# Patient Record
Sex: Female | Born: 1950 | Race: Black or African American | Hispanic: No | Marital: Single | Smoking: Never smoker
Health system: Southern US, Community
[De-identification: ages and names within clinical notes are randomized; demographics above are authoritative.]

## PROBLEM LIST (undated history)

## (undated) DIAGNOSIS — J449 Chronic obstructive pulmonary disease, unspecified: Secondary | ICD-10-CM

## (undated) DIAGNOSIS — G309 Alzheimer's disease, unspecified: Secondary | ICD-10-CM

## (undated) DIAGNOSIS — F0280 Dementia in other diseases classified elsewhere without behavioral disturbance: Secondary | ICD-10-CM

## (undated) DIAGNOSIS — I10 Essential (primary) hypertension: Secondary | ICD-10-CM

## (undated) DIAGNOSIS — B182 Chronic viral hepatitis C: Secondary | ICD-10-CM

## (undated) DIAGNOSIS — R569 Unspecified convulsions: Secondary | ICD-10-CM

## (undated) HISTORY — PX: ABDOMINAL HYSTERECTOMY: SHX81

---

## 2004-01-09 ENCOUNTER — Other Ambulatory Visit: Payer: Self-pay

## 2004-08-05 ENCOUNTER — Other Ambulatory Visit: Payer: Self-pay

## 2005-02-03 ENCOUNTER — Ambulatory Visit: Payer: Self-pay | Admitting: Internal Medicine

## 2006-01-17 ENCOUNTER — Emergency Department: Payer: Self-pay | Admitting: Emergency Medicine

## 2008-03-10 ENCOUNTER — Ambulatory Visit: Payer: Self-pay | Admitting: Internal Medicine

## 2008-05-31 ENCOUNTER — Other Ambulatory Visit: Payer: Self-pay

## 2008-05-31 ENCOUNTER — Inpatient Hospital Stay: Payer: Self-pay | Admitting: Internal Medicine

## 2008-06-03 ENCOUNTER — Emergency Department: Payer: Self-pay | Admitting: Emergency Medicine

## 2008-06-25 ENCOUNTER — Observation Stay: Payer: Self-pay | Admitting: Internal Medicine

## 2008-06-25 ENCOUNTER — Other Ambulatory Visit: Payer: Self-pay

## 2008-06-26 ENCOUNTER — Emergency Department: Payer: Self-pay | Admitting: Emergency Medicine

## 2008-06-26 ENCOUNTER — Other Ambulatory Visit: Payer: Self-pay

## 2008-06-27 ENCOUNTER — Inpatient Hospital Stay: Payer: Self-pay | Admitting: Internal Medicine

## 2008-06-27 ENCOUNTER — Other Ambulatory Visit: Payer: Self-pay

## 2008-08-01 ENCOUNTER — Other Ambulatory Visit: Payer: Self-pay

## 2008-08-01 ENCOUNTER — Emergency Department: Payer: Self-pay | Admitting: Emergency Medicine

## 2008-12-11 ENCOUNTER — Emergency Department: Payer: Self-pay | Admitting: Emergency Medicine

## 2008-12-23 ENCOUNTER — Emergency Department: Payer: Self-pay | Admitting: Emergency Medicine

## 2008-12-31 ENCOUNTER — Ambulatory Visit: Payer: Self-pay | Admitting: Cardiovascular Disease

## 2009-01-17 ENCOUNTER — Inpatient Hospital Stay: Payer: Self-pay | Admitting: Internal Medicine

## 2009-01-30 IMAGING — CT CT HEAD WITHOUT CONTRAST
2 series · 15 of 30 positions shown, 19 images · non-contrast
Comparison: No comparison

REASON FOR EXAM: DIZZY, WEAK
COMMENTS:

PROCEDURE:     CT  - CT HEAD WITHOUT CONTRAST  - June 26, 2008 [DATE]
RESULT:     Noncontrast CT of the brain dated 06/26/2008.
INDICATION: Dizziness
TECHNIQUE: Multiple axial images from the foramen magnum to the vertex were
obtained without IV contrast.

[Series 2: without · axial · non-contrast · 0.39mm/px · z∈[-132,-12]mm · 13 of 29 slices shown, 17 images]
[im 3/29  brain]
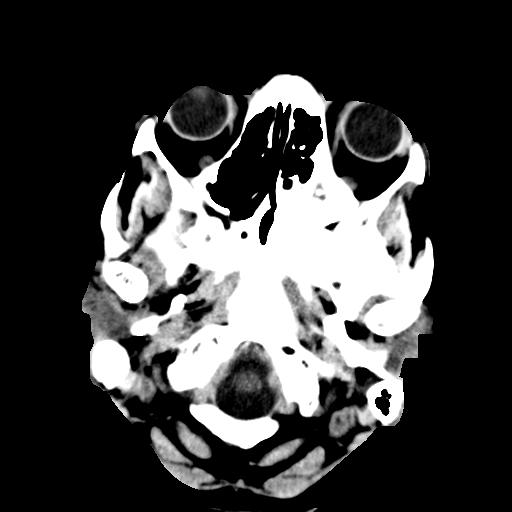
[im 3/29  bone]
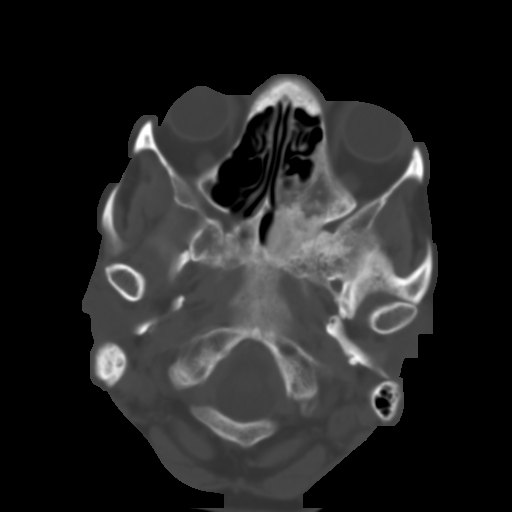
[im 5/29  brain]
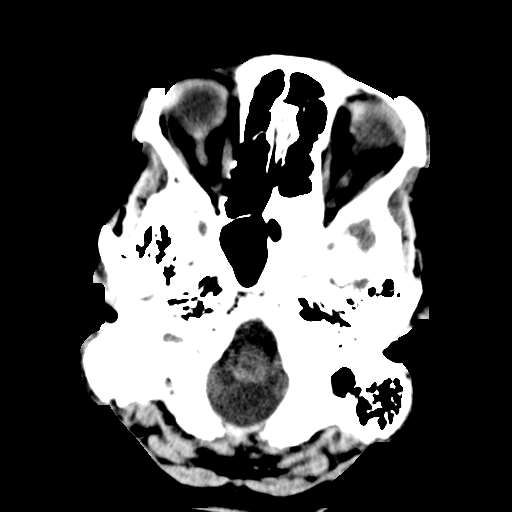
[im 7/29  brain]
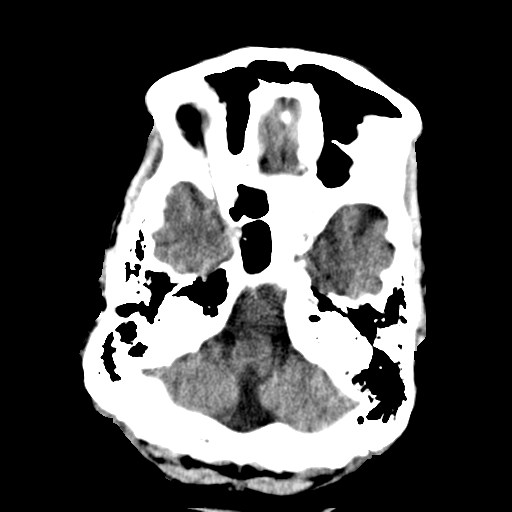
[im 9/29  brain]
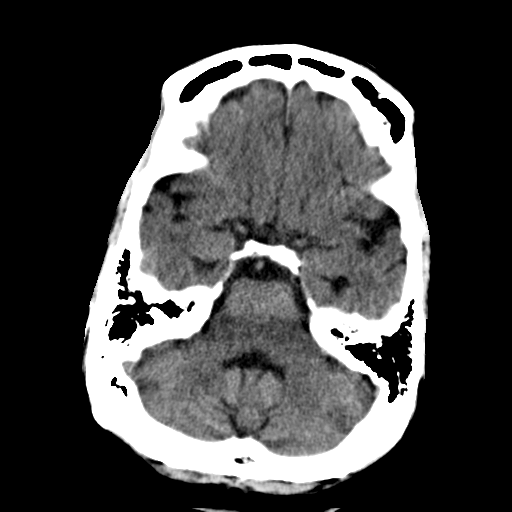
[im 11/29  brain]
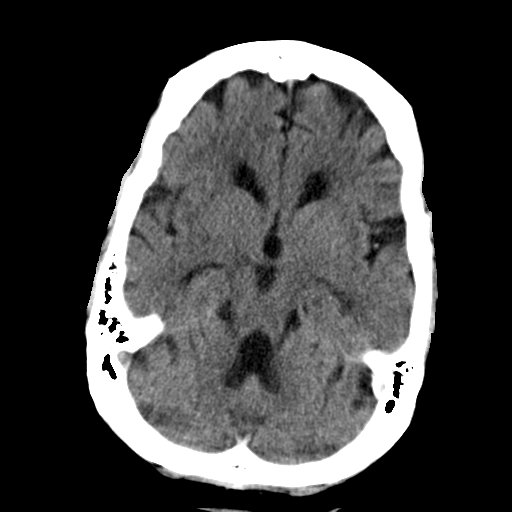
[im 11/29  bone]
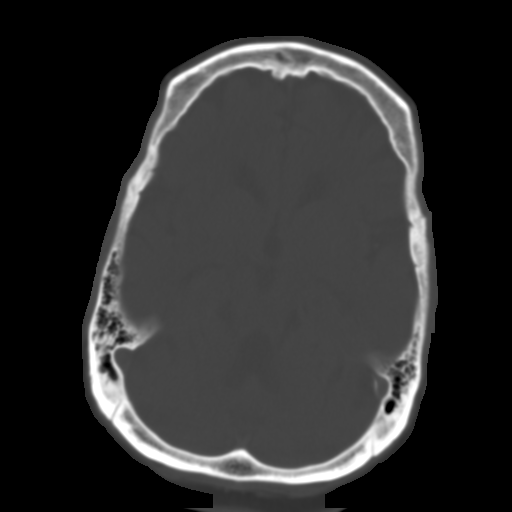
[im 13/29  brain]
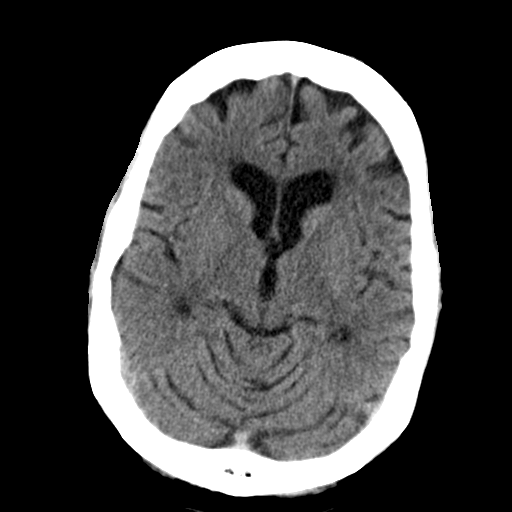
[im 15/29  brain]
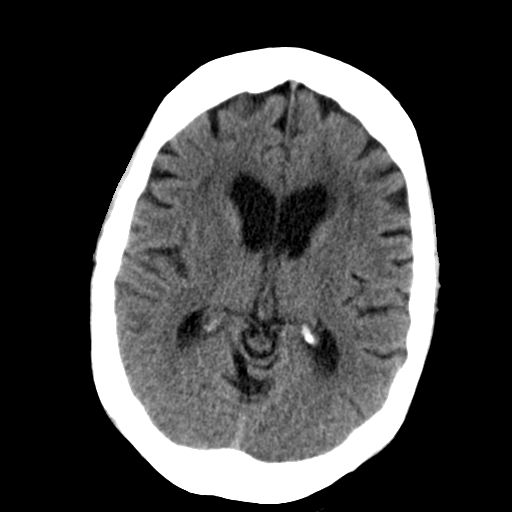
[im 17/29  brain]
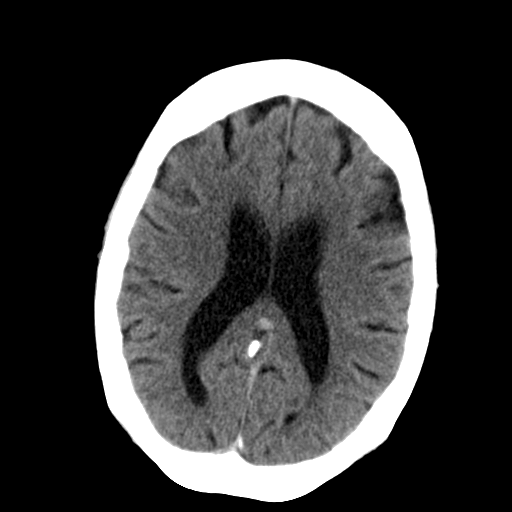
[im 19/29  brain]
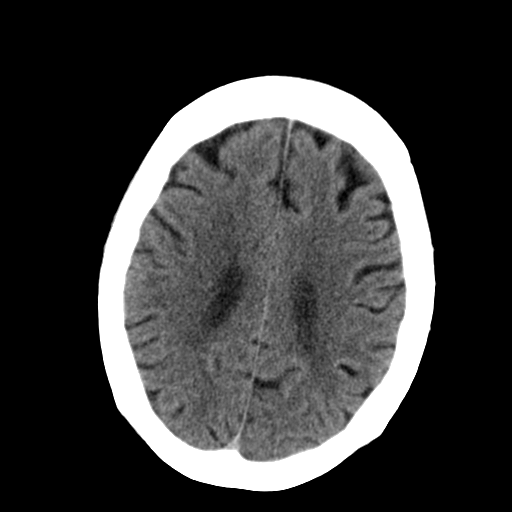
[im 19/29  bone]
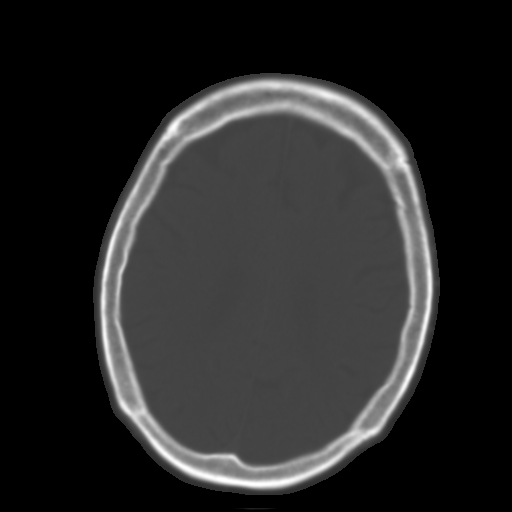
[im 21/29  brain]
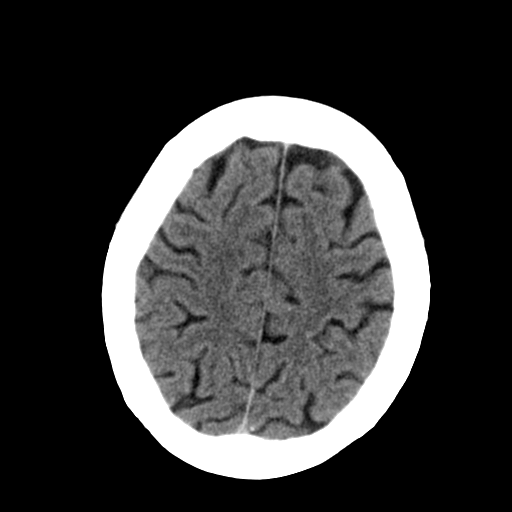
[im 23/29  brain]
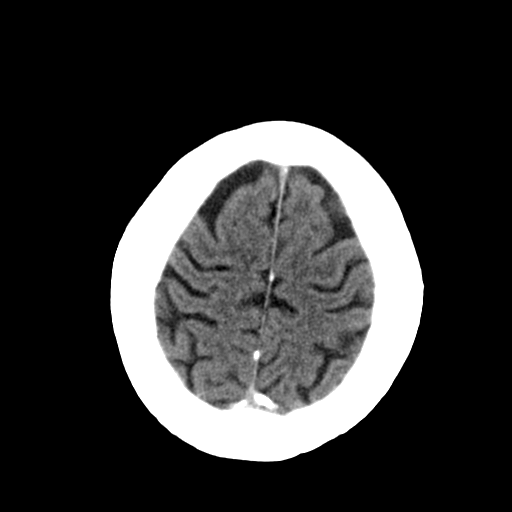
[im 25/29  brain]
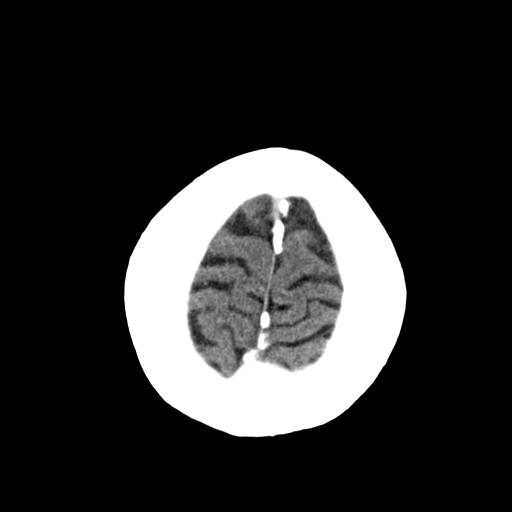
[im 27/29  brain]
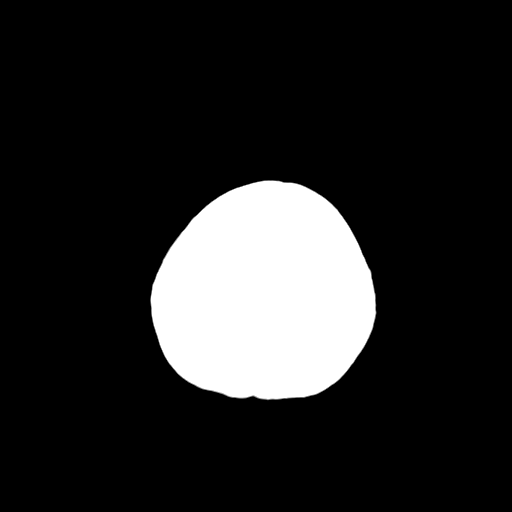
[im 27/29  bone]
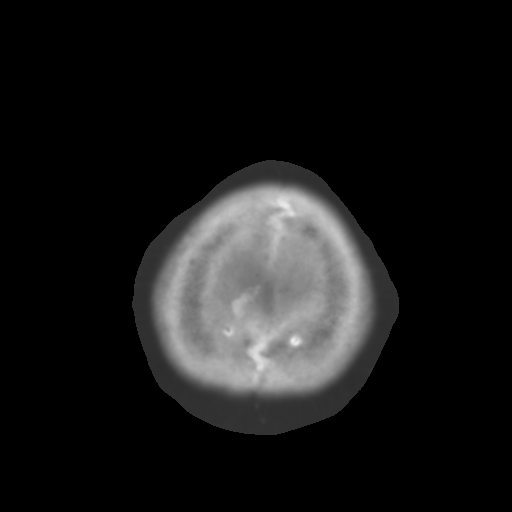

[Series 3: bone · axial · 0.39mm/px · z∈[-132,-112]mm · 2 of 29 slices shown]
[im 3/29  bone]
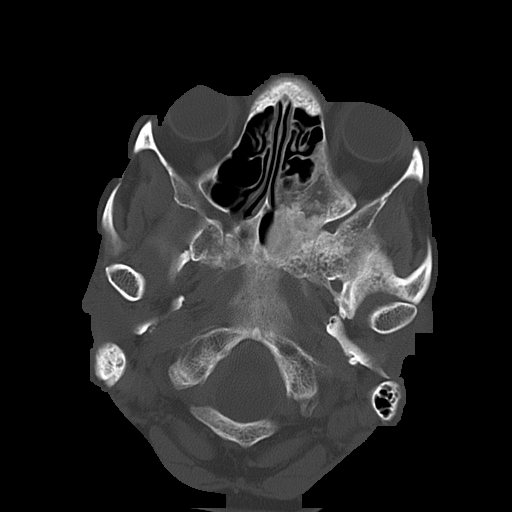
[im 7/29  bone]
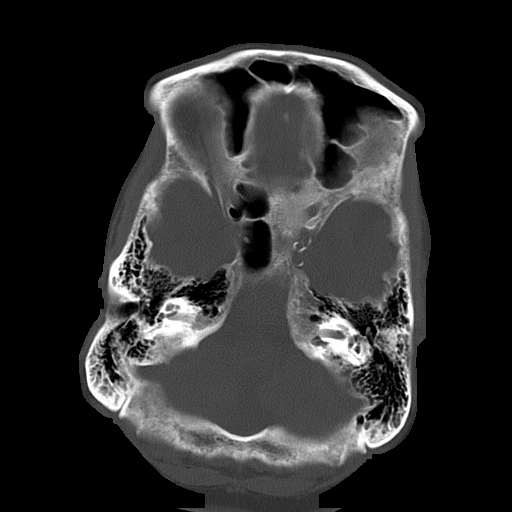

[15 of 30 positions shown; findings below may reference images not displayed]

FINDINGS: There is no evidence for mass effect, midline shift, or extra-axial fluid
collections.There is generalized cerebral atrophy. There is mild
periventricular white matter low attenuation likely secondary to
microangiopathy.  There is no evidence for space-occupying lesion or
intracranial hemorrhage. There is no evidence for cortical-based area of
infarction.

Ventricles and sulci are appropriate for the patient's age. The basal
cisterns are patent.

Visualized portions of the orbits are unremarkable. The paranasal sinuses
and mastoid air cells are unremarkable.

There is partial opacification of the right mastoid air cells. The left
sphenoid demonstrates a groundglass expansile lesion extending anteriorly to
involve the left ethmoid with deformity of the left orbit and involving the
left side of the clivus most consistent with fibrous dysplasia.
IMPRESSION: No acute intracranial process.

Partial opacification of the right mastoid air cells.

## 2009-03-01 ENCOUNTER — Emergency Department: Payer: Self-pay | Admitting: Emergency Medicine

## 2009-03-12 ENCOUNTER — Ambulatory Visit: Payer: Self-pay | Admitting: Internal Medicine

## 2009-03-18 ENCOUNTER — Ambulatory Visit: Payer: Self-pay | Admitting: Internal Medicine

## 2009-10-11 ENCOUNTER — Ambulatory Visit: Payer: Self-pay | Admitting: Gastroenterology

## 2010-03-14 ENCOUNTER — Ambulatory Visit: Payer: Self-pay | Admitting: Internal Medicine

## 2012-02-12 ENCOUNTER — Emergency Department: Payer: Self-pay | Admitting: Emergency Medicine

## 2012-02-12 LAB — URINALYSIS, COMPLETE
Bacteria: NONE SEEN
Bilirubin,UR: NEGATIVE
Blood: NEGATIVE
Glucose,UR: NEGATIVE mg/dL (ref 0–75)
Hyaline Cast: 7
Ketone: NEGATIVE
Nitrite: NEGATIVE
Specific Gravity: 1.016 (ref 1.003–1.030)
Squamous Epithelial: 3
WBC UR: 1 /HPF (ref 0–5)

## 2012-02-12 LAB — COMPREHENSIVE METABOLIC PANEL
Alkaline Phosphatase: 83 U/L (ref 50–136)
Anion Gap: 12 (ref 7–16)
BUN: 19 mg/dL — ABNORMAL HIGH (ref 7–18)
Bilirubin,Total: 0.3 mg/dL (ref 0.2–1.0)
Chloride: 102 mmol/L (ref 98–107)
Co2: 24 mmol/L (ref 21–32)
Creatinine: 1.07 mg/dL (ref 0.60–1.30)
EGFR (African American): >60
EGFR (Non-African Amer.): 55 — ABNORMAL LOW
Osmolality: 277 (ref 275–301)
SGPT (ALT): 31 U/L
Sodium: 138 mmol/L (ref 136–145)
Total Protein: 8.8 g/dL — ABNORMAL HIGH (ref 6.4–8.2)

## 2012-02-12 LAB — LIPASE, BLOOD: Lipase: 309 U/L (ref 73–393)

## 2012-02-12 LAB — CBC
MCHC: 32.4 g/dL (ref 32.0–36.0)
MCV: 86 fL (ref 80–100)
Platelet: 157 10*3/uL (ref 150–440)
RDW: 14.3 % (ref 11.5–14.5)
WBC: 4.5 10*3/uL (ref 3.6–11.0)

## 2012-05-01 ENCOUNTER — Ambulatory Visit: Payer: Self-pay | Admitting: Internal Medicine

## 2012-05-18 ENCOUNTER — Emergency Department: Payer: Self-pay | Admitting: Emergency Medicine

## 2012-05-18 LAB — COMPREHENSIVE METABOLIC PANEL
Albumin: 3.2 g/dL — ABNORMAL LOW (ref 3.4–5.0)
BUN: 14 mg/dL (ref 7–18)
Creatinine: 1.14 mg/dL (ref 0.60–1.30)
EGFR (Non-African Amer.): 51 — ABNORMAL LOW
Osmolality: 268 (ref 275–301)
Potassium: 3.9 mmol/L (ref 3.5–5.1)
SGOT(AST): 48 U/L — ABNORMAL HIGH (ref 15–37)
SGPT (ALT): 44 U/L
Sodium: 134 mmol/L — ABNORMAL LOW (ref 136–145)

## 2012-05-18 LAB — CBC WITH DIFFERENTIAL/PLATELET
Basophil %: 0.7 %
Eosinophil #: 0 10*3/uL (ref 0.0–0.7)
Eosinophil %: 0.8 %
HGB: 13.8 g/dL (ref 12.0–16.0)
Lymphocyte #: 2.6 10*3/uL (ref 1.0–3.6)
Lymphocyte %: 46.7 %
Monocyte %: 5.8 %
Platelet: 156 10*3/uL (ref 150–440)
RBC: 5.09 10*6/uL (ref 3.80–5.20)
RDW: 14.6 % — ABNORMAL HIGH (ref 11.5–14.5)
WBC: 5.6 10*3/uL (ref 3.6–11.0)

## 2012-05-18 LAB — TROPONIN I: Troponin-I: <0.02 ng/mL

## 2012-05-18 LAB — MAGNESIUM: Magnesium: 1.7 mg/dL — ABNORMAL LOW

## 2012-05-18 LAB — CK TOTAL AND CKMB (NOT AT ARMC)
CK, Total: 117 U/L (ref 21–215)
CK-MB: 1.1 ng/mL (ref 0.5–3.6)

## 2012-10-28 ENCOUNTER — Ambulatory Visit: Payer: Self-pay | Admitting: Emergency Medicine

## 2013-01-24 ENCOUNTER — Ambulatory Visit: Payer: Self-pay | Admitting: Cardiovascular Disease

## 2013-12-23 ENCOUNTER — Ambulatory Visit: Payer: Self-pay | Admitting: Internal Medicine

## 2015-03-09 ENCOUNTER — Ambulatory Visit: Payer: Self-pay | Admitting: Podiatry

## 2015-03-21 ENCOUNTER — Emergency Department (HOSPITAL_COMMUNITY): Payer: Medicare Other

## 2015-03-21 ENCOUNTER — Emergency Department (HOSPITAL_COMMUNITY)
Admission: EM | Admit: 2015-03-21 | Discharge: 2015-03-21 | Disposition: A | Discharge status: Home / Self Care | Payer: Medicare Other | Attending: Emergency Medicine | Admitting: Emergency Medicine

## 2015-03-21 ENCOUNTER — Encounter (HOSPITAL_COMMUNITY): Payer: Self-pay

## 2015-03-21 DIAGNOSIS — Z7982 Long term (current) use of aspirin: Secondary | ICD-10-CM | POA: Diagnosis not present

## 2015-03-21 DIAGNOSIS — Z79899 Other long term (current) drug therapy: Secondary | ICD-10-CM | POA: Insufficient documentation

## 2015-03-21 DIAGNOSIS — Z7951 Long term (current) use of inhaled steroids: Secondary | ICD-10-CM | POA: Insufficient documentation

## 2015-03-21 DIAGNOSIS — L02212 Cutaneous abscess of back [any part, except buttock]: Secondary | ICD-10-CM | POA: Insufficient documentation

## 2015-03-21 DIAGNOSIS — R9389 Abnormal findings on diagnostic imaging of other specified body structures: Secondary | ICD-10-CM

## 2015-03-21 DIAGNOSIS — L0291 Cutaneous abscess, unspecified: Secondary | ICD-10-CM

## 2015-03-21 DIAGNOSIS — R05 Cough: Secondary | ICD-10-CM

## 2015-03-21 DIAGNOSIS — Z7902 Long term (current) use of antithrombotics/antiplatelets: Secondary | ICD-10-CM | POA: Diagnosis not present

## 2015-03-21 DIAGNOSIS — R059 Cough, unspecified: Secondary | ICD-10-CM

## 2015-03-21 MED ORDER — SULFAMETHOXAZOLE-TRIMETHOPRIM 800-160 MG PO TABS: 1.0000 | ORAL_TABLET | Freq: Two times a day (BID) | ORAL | Status: AC

## 2015-03-21 NOTE — ED Notes (Signed)
Staff [facility] observed pt. To have a prolonged "coughing fit".  The patient is nonplussed by this incident, treating it as "something that happens once-in-a-while".  She is more interested in a small abscess on her low back just to right of midline.  She is alert and in no distress.

## 2015-03-21 NOTE — Consult Note (Signed)
PATIENT NAME:  Tanya Rivas, Tanya Rivas MR#:  [id number] DATE OF BIRTH:  1951-06-15  DATE OF CONSULTATION:  05/18/2012  REFERRING PHYSICIAN:   CONSULTING PHYSICIAN:  Nechemia Chiappetta A. Allena Katz, MD  PRIMARY CARE PHYSICIAN:  Dr. Yves Dill  CHIEF COMPLAINT:  Syncopal episode.   HISTORY OF PRESENT ILLNESS: The patient is a pleasant 65 year old African American female who is a resident of  [facility] assisted living, who comes to the Emergency Room after she had a syncopal episode today. The patient reports sitting outside on the porch for some time. She started feeling hot and flushed and decided to return inside the house. She closed the door and immediately she felt dizzy and had a syncopal episode where she remembered the whole episode and going down on the floor. She denies any injury, any nausea or vomiting. No seizure witnessed. No incontinence of urine or stool. She comes to the Emergency Room, has been hemodynamically stable, no orthostasis was noted in the ER. When I initially went to see the patient, the patient had gone out for a walk with her friend. She returned back to the room, was feeling hungry and wanted to eat. She did not have any focal weakness or any dizziness and her vitals remained stable.   PAST MEDICAL HISTORY:  1. Seizure disorder secondary to alcohol dependence.  2. 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 The patient quit drinking.  3. History of hypertension.  4. Hyperlipidemia.  5. Dementia.  6. Tobacco abuse.   SOCIAL HISTORY: She is a resident at [facility] assisted living. She smokes about 1 pack in 2 to 3 days. Denies any alcohol use or drug use.   ALLERGIES: No known drug allergies.   MEDICATIONS: 1. Advair 115/21-mcg inhalation, 2 puffs b.i.d.  2. Amlodipine/benazepril 5/10, 1 p.o. daily.  3. Aspirin 81 mg daily.  4. Plavix 75 mg daily.  5. Crestor 20 mg at bedtime. 6. Donepezil 10 mg at bedtime.  7. Lansoprazole 30 mg daily.   8. Keppra 500 mg b.i.d.  9. Remeron 15 mg at bedtime.  10. Multivitamin p.o. daily.  11. Spiriva 18-mcg inhalation daily.  12. Thiamine 100 mg daily.  13. Vitamin D 2000 mg daily.   PAST SURGICAL HISTORY:  1. Tonsillectomy.  2. Hysterectomy.  3. Cardiac catheterization in the past.   FAMILY HISTORY: No history of diabetes or hypertension.   REVIEW OF SYSTEMS: CONSTITUTIONAL: No fever, fatigue, or weakness. EYES: No blurred or double vision. ENT: No tinnitus, ear pain, or hearing loss. RESPIRATORY: No cough, wheeze, hemoptysis, or dyspnea. CARDIOVASCULAR: No chest pain, orthopnea, or edema. GASTROINTESTINAL: No nausea, vomiting, diarrhea, abdominal pain, hematemesis, or gastroesophageal reflux disease. GU: No dysuria or hematuria. ENDOCRINE: No polyuria, nocturia, or thyroid problems. HEMATOLOGY: No anemia or easy bruising. SKIN: No acne or rash. MUSCULOSKELETAL: Positive for arthritis. NEUROLOGIC: No cerebrovascular accident or transient ischemic attack. PSYCH: Positive for dementia. All other systems reviewed and negative.   PHYSICAL EXAMINATION:  GENERAL: The patient is awake, alert, oriented times three, not in acute distress.   VITAL SIGNS: Afebrile, pulse 71, blood pressure 131/86, sats 98% on room air.   HEENT: Atraumatic, normocephalic. Pupils are equal, round, and reactive to light and accommodation. Extraocular movements intact. Oral mucosa is moist.   NECK: Supple. No JVD. No carotid bruit.   LUNGS: Clear to auscultation bilaterally. No rales, rhonchi, respiratory distress, or labored breathing.   CARDIOVASCULAR: Both the heart sounds are normal. Rate and rhythm are regular.  PMI not lateralized. Chest is nontender.   EXTREMITIES: Good pedal pulses, good femoral pulses. No lower extremity edema.   ABDOMEN: Soft, benign, and nontender. No organomegaly. Positive bowel sounds.   NEUROLOGIC:  Grossly intact cranial nerves II-XII. No motor or sensory deficits.   PSYCH: The  patient is awake, alert, oriented times three.   SKIN: Warm and dry.   LABORATORY DATA: EKG shows sinus rhythm with marked sinus arrhythmia. CT of the head: Atrophy with chronic small vessel ischemic disease, no acute intracranial abnormality. CBC within normal limits. Comprehensive metabolic panel within normal limits except SGOT of 48, sodium 134, and albumin of 3.2. Cardiac enzymes- first set negative. Blood glucose 114.  Orthostatic vitals were normal.   ASSESSMENT: 64 year old Tanya Rivas with: 1. Syncopal episode, suspected vasovagal. The patient reports she was out in the heat, felt hot and went in the house and had a syncopal episode, remembering the episode. No witnessed seizure. The patient went out for a walk with her boyfriend earlier in the Emergency Room. Orthostatic vitals are normal. Doing well.  No neuro deficits. EKG normal sinus rhythm. CT head negative.  2. Labs at baseline. 3. History of seizure disorder on Keppra.  4. 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 Does not drink any more.  5. Hyperlipidemia, on Crestor.   The patient remained clinically stable through the ER stay. She did not have any neuro deficits. Her work-up remains negative. The patient falls? at baseline. We will continue home medications, discharge the patient home to her assisted living. Followup in one week. I have already left a message for Oran ReinBrenda Touraine, the facility's coordinator to come pick Tanya Rivas up.  The patient will follow up with Dr. Yves DillNeelam Khan in 1 to 2 weeks.   TIME SPENT:  50 minutes.  ____________________________ Wylie HailSona A. Allena KatzPatel, MD sap:bjt D: 05/18/2012 19:46:19 ET T: 05/19/2012 11:11:51 ET JOB#: [id number]  cc: Giovanni Biby A. Allena KatzPatel, MD, <Dictator> Margaretann LovelessNeelam S. Khan, MD Willow OraSONA A Macari Zalesky MD ELECTRONICALLY SIGNED 05/30/2012 14:09

## 2015-03-21 NOTE — ED Provider Notes (Signed)
CSN: [id number]     Arrival date & time 03/21/15  1829 History   First MD Initiated Contact with Patient 03/21/15 1915     Chief Complaint  Patient presents with  . Cough     (Consider location/radiation/quality/duration/timing/severity/associated sxs/prior Treatment) HPI   Tanya Rivas is a 64 y.o. female sent from [facility] for evaluation of a prolonged coughing fit. Patient states that she does have cough every once in a while. Patient denies chest pain, shortness of breath, productive cough, fever, chills, nausea, vomiting, change in bowel or bladder habits she notes to have a slightly painful rash to her right back. Would like to have that evaluated.  History reviewed. No pertinent past medical history. No past surgical history on file. No family history on file. History  Substance Use Topics  . Smoking status: Never Smoker   . Smokeless tobacco: Not on file  . Alcohol Use: No   OB History    No data available     Review of Systems  10 systems reviewed and found to be negative, except as noted in the HPI.   Allergies  Review of patient's allergies indicates no known allergies.  Home Medications   Prior to Admission medications   Medication Sig Start Date End Date Taking? Authorizing Provider  acetaminophen (TYLENOL) 500 MG tablet Take 500 mg by mouth every 4 (four) hours as needed for fever.   Yes Historical Provider, MD  alum & mag hydroxide-simeth (MAALOX/MYLANTA) 200-200-20 MG/5ML suspension Take 30 mLs by mouth every 6 (six) hours as needed for indigestion or heartburn.   Yes Historical Provider, MD  amLODipine (NORVASC) 5 MG tablet Take 5 mg by mouth daily.   Yes Historical Provider, MD  aspirin 81 MG chewable tablet Chew 81 mg by mouth daily.   Yes Historical Provider, MD  benazepril (LOTENSIN) 10 MG tablet Take 10 mg by mouth daily.   Yes Historical Provider, MD  cholecalciferol (VITAMIN D) 1000 UNITS tablet Take 1,000 Units by mouth daily.   Yes  Historical Provider, MD  clopidogrel (PLAVIX) 75 MG tablet Take 75 mg by mouth daily.   Yes Historical Provider, MD  donepezil (ARICEPT) 10 MG tablet Take 10 mg by mouth at bedtime.   Yes Historical Provider, MD  Fluticasone-Salmeterol (ADVAIR) 250-50 MCG/DOSE AEPB Inhale 1 puff into the lungs 2 (two) times daily.   Yes Historical Provider, MD  furosemide (LASIX) 20 MG tablet Take 20 mg by mouth daily.   Yes Historical Provider, MD  guaiFENesin (ROBITUSSIN) 100 MG/5ML SOLN Take 10 mLs by mouth every 6 (six) hours as needed for cough or to loosen phlegm.   Yes Historical Provider, MD  isosorbide mononitrate (IMDUR) 60 MG 24 hr tablet Take 60 mg by mouth daily.   Yes Historical Provider, MD  levETIRAcetam (KEPPRA) 500 MG tablet Take 500 mg by mouth 2 (two) times daily.   Yes Historical Provider, MD  loperamide (IMODIUM) 2 MG capsule Take 2 mg by mouth as needed for diarrhea or loose stools.   Yes Historical Provider, MD  magnesium hydroxide (MILK OF MAGNESIA) 400 MG/5ML suspension Take 30 mLs by mouth at bedtime as needed for mild constipation.   Yes Historical Provider, MD  Melatonin 3 MG TABS Take 1 tablet by mouth at bedtime.   Yes Historical Provider, MD  memantine (NAMENDA XR) 14 MG CP24 24 hr capsule Take 14 mg by mouth daily.   Yes Historical Provider, MD  metoprolol (LOPRESSOR) 50 MG tablet Take 50 mg  by mouth 2 (two) times daily.   Yes Historical Provider, MD  mirtazapine (REMERON) 15 MG tablet Take 15 mg by mouth at bedtime.   Yes Historical Provider, MD  Multiple Vitamins-Minerals (MULTIVITAMIN & MINERAL PO) Take 1 tablet by mouth daily.   Yes Historical Provider, MD  neomycin-bacitracin-polymyxin (NEOSPORIN) [phone] ointment Apply 1 application topically daily as needed.   Yes Historical Provider, MD  ranolazine (RANEXA) 1000 MG SR tablet Take 1,000 mg by mouth 2 (two) times daily.   Yes Historical Provider, MD  thiamine (VITAMIN B-1) 100 MG tablet Take 100 mg by mouth daily.   Yes  Historical Provider, MD  tiotropium (SPIRIVA) 18 MCG inhalation capsule Place 18 mcg into inhaler and inhale daily.   Yes Historical Provider, MD  traZODone (DESYREL) 50 MG tablet Take 50 mg by mouth at bedtime.   Yes Historical Provider, MD  sulfamethoxazole-trimethoprim (BACTRIM DS) 800-160 MG per tablet Take 1 tablet by mouth 2 (two) times daily. 03/21/15 03/27/15  Jasira Robinson, PA-C   BP 148/80 mmHg  Pulse 94  Temp(Src) 98.7 F (37.1 C) (Oral)  Resp 18  SpO2 97% Physical Exam  Constitutional: She is oriented to person, place, and time. She appears well-developed and well-nourished. No distress.  HENT:  Head: Normocephalic and atraumatic.  Mouth/Throat: Oropharynx is clear and moist.  Eyes: Conjunctivae and EOM are normal. Pupils are equal, round, and reactive to light.  Neck: Normal range of motion.  Cardiovascular: Normal rate, regular rhythm and intact distal pulses.   Pulmonary/Chest: Effort normal and breath sounds normal.  Abdominal: Soft. There is no tenderness.  Musculoskeletal: Normal range of motion.  Neurological: She is alert and oriented to person, place, and time.  Skin: She is not diaphoretic.  Half centimeter early abscess with no surrounding cellulitis or drainage. Mildly tender to palpation, no fluctuance.  Psychiatric: She has a normal mood and affect.  Nursing note and vitals reviewed.   ED Course  Procedures (including critical care time) Labs Review Labs Reviewed - No data to display  Imaging Review Dg Chest 2 View  03/21/2015   CLINICAL DATA:  Abnormal chest x-ray.  Cough  EXAM: CHEST  2 VIEW  COMPARISON:  05/18/2012  FINDINGS: Heart size and vascularity normal. Lungs are clear without infiltrate or effusion. Negative for mass lesion. No change from the prior study.  IMPRESSION: No active cardiopulmonary disease.   Electronically Signed   By: Marlan Palau M.D.   On: 03/21/2015 20:48     EKG Interpretation None      MDM   Final diagnoses:   Cough  Abscess    Filed Vitals:   03/21/15 1830 03/21/15 2129  BP: 126/90 148/80  Pulse: 119 94  Temp: 97.7 F (36.5 C) 98.7 F (37.1 C)  TempSrc: Oral Oral  Resp: 20 18  SpO2: 97% 97%    Tanya Rivas is a pleasant 64 y.o. female presenting with for evaluation after coughing fit. Lung sounds are clear to auscultation bilaterally, patient saturating well on room air, she is afebrile no chest pain or shortness of breath to suggest pneumonia. Chest x-rays without infiltrate. She has a small early abscess to the lower back. Do not think I and D will be helpful at this point. Will start her on Bactrim and have advised her to use warm compresses.  Evaluation does not show pathology that would require ongoing emergent intervention or inpatient treatment. Pt is hemodynamically stable and mentating appropriately. Discussed findings and plan with patient/guardian, who  agrees with care plan. All questions answered. Return precautions discussed and outpatient follow up given.   Discharge Medication List as of 03/21/2015  9:04 PM    START taking these medications   Details  sulfamethoxazole-trimethoprim (BACTRIM DS) 800-160 MG per tablet Take 1 tablet by mouth 2 (two) times daily., Starting 03/21/2015, Until Sat 03/27/15, Print             [employer] Rangel Echeverri, PA-C 03/22/15 0030  Lorre NickAnthony Allen, MD 03/23/15 [id number]

## 2015-03-21 NOTE — Discharge Instructions (Signed)
If you see signs of infection (warmth, redness, tenderness, pus, sharp increase in pain, fever, red streaking) immediately return to the emergency department.  Please follow with your primary care doctor in the next 2 days for a check-up. They must obtain records for further management.   Do not hesitate to return to the Emergency Department for any new, worsening or concerning symptoms.    Abscess An abscess (boil or furuncle) is an infected area on or under the skin. This area is filled with yellowish-white fluid (pus) and other material (debris). HOME CARE   Only take medicines as told by your doctor.  If you were given antibiotic medicine, take it as directed. Finish the medicine even if you start to feel better.  If gauze is used, follow your doctor's directions for changing the gauze.  To avoid spreading the infection:  Keep your abscess covered with a bandage.  Wash your hands well.  Do not share personal care items, towels, or whirlpools with others.  Avoid skin contact with others.  Keep your skin and clothes clean around the abscess.  Keep all doctor visits as told. GET HELP RIGHT AWAY IF:   You have more pain, puffiness (swelling), or redness in the wound site.  You have more fluid or blood coming from the wound site.  You have muscle aches, chills, or you feel sick.  You have a fever. MAKE SURE YOU:   Understand these instructions.  Will watch your condition.  Will get help right away if you are not doing well or get worse. Document Released: 05/01/2008 Document Revised: 05/14/2012 Document Reviewed: 01/26/2012 [facility] Patient Information 2015 [employer] [place]. This information is not intended to replace advice given to you by your health care provider. Make sure you discuss any questions you have with your health care provider.

## 2015-03-21 NOTE — ED Notes (Signed)
Notified [facility] for transportation to [facility] 

## 2015-04-15 ENCOUNTER — Encounter (HOSPITAL_COMMUNITY): Payer: Self-pay | Admitting: Emergency Medicine

## 2015-04-15 ENCOUNTER — Emergency Department (HOSPITAL_COMMUNITY)
Admission: EM | Admit: 2015-04-15 | Discharge: 2015-04-15 | Disposition: A | Discharge status: Home / Self Care | Payer: Medicare Other | Attending: Emergency Medicine | Admitting: Emergency Medicine

## 2015-04-15 ENCOUNTER — Emergency Department (HOSPITAL_COMMUNITY): Payer: Medicare Other

## 2015-04-15 DIAGNOSIS — R569 Unspecified convulsions: Secondary | ICD-10-CM

## 2015-04-15 DIAGNOSIS — Z8619 Personal history of other infectious and parasitic diseases: Secondary | ICD-10-CM | POA: Diagnosis not present

## 2015-04-15 DIAGNOSIS — J449 Chronic obstructive pulmonary disease, unspecified: Secondary | ICD-10-CM | POA: Insufficient documentation

## 2015-04-15 DIAGNOSIS — Z79899 Other long term (current) drug therapy: Secondary | ICD-10-CM | POA: Diagnosis not present

## 2015-04-15 DIAGNOSIS — I10 Essential (primary) hypertension: Secondary | ICD-10-CM | POA: Diagnosis not present

## 2015-04-15 DIAGNOSIS — Z7951 Long term (current) use of inhaled steroids: Secondary | ICD-10-CM | POA: Diagnosis not present

## 2015-04-15 DIAGNOSIS — Z7902 Long term (current) use of antithrombotics/antiplatelets: Secondary | ICD-10-CM | POA: Diagnosis not present

## 2015-04-15 DIAGNOSIS — F0280 Dementia in other diseases classified elsewhere without behavioral disturbance: Secondary | ICD-10-CM | POA: Insufficient documentation

## 2015-04-15 DIAGNOSIS — G309 Alzheimer's disease, unspecified: Secondary | ICD-10-CM | POA: Diagnosis not present

## 2015-04-15 DIAGNOSIS — G40909 Epilepsy, unspecified, not intractable, without status epilepticus: Secondary | ICD-10-CM | POA: Insufficient documentation

## 2015-04-15 DIAGNOSIS — Z7982 Long term (current) use of aspirin: Secondary | ICD-10-CM | POA: Insufficient documentation

## 2015-04-15 HISTORY — DX: Essential (primary) hypertension: I10

## 2015-04-15 HISTORY — DX: Unspecified convulsions: R56.9

## 2015-04-15 HISTORY — DX: Dementia in other diseases classified elsewhere, unspecified severity, without behavioral disturbance, psychotic disturbance, mood disturbance, and anxiety: F02.80

## 2015-04-15 HISTORY — DX: Alzheimer's disease, unspecified: G30.9

## 2015-04-15 HISTORY — DX: Chronic obstructive pulmonary disease, unspecified: J44.9

## 2015-04-15 HISTORY — DX: Chronic viral hepatitis C: B18.2

## 2015-04-15 LAB — CBC WITH DIFFERENTIAL/PLATELET
Basophils Absolute: 0 10*3/uL (ref 0.0–0.1)
Basophils Relative: 0 % (ref 0–1)
EOS PCT: 2 % (ref 0–5)
Eosinophils Absolute: 0.1 10*3/uL (ref 0.0–0.7)
HEMATOCRIT: 33.8 % — AB (ref 36.0–46.0)
Hemoglobin: 10.5 g/dL — ABNORMAL LOW (ref 12.0–15.0)
Lymphocytes Relative: 51 % — ABNORMAL HIGH (ref 12–46)
Lymphs Abs: 2.6 10*3/uL (ref 0.7–4.0)
MCH: 27.8 pg (ref 26.0–34.0)
MCHC: 31.1 g/dL (ref 30.0–36.0)
MCV: 89.4 fL (ref 78.0–100.0)
MONO ABS: 0.4 10*3/uL (ref 0.1–1.0)
Monocytes Relative: 8 % (ref 3–12)
NEUTROS ABS: 2 10*3/uL (ref 1.7–7.7)
Neutrophils Relative %: 39 % — ABNORMAL LOW (ref 43–77)
PLATELETS: 178 10*3/uL (ref 150–400)
RBC: 3.78 MIL/uL — ABNORMAL LOW (ref 3.87–5.11)
RDW: 14.6 % (ref 11.5–15.5)
WBC: 5 10*3/uL (ref 4.0–10.5)

## 2015-04-15 LAB — I-STAT TROPONIN, ED: Troponin i, poc: 0 ng/mL (ref 0.00–0.08)

## 2015-04-15 LAB — COMPREHENSIVE METABOLIC PANEL
ALK PHOS: 67 U/L (ref 38–126)
ALT: 29 U/L (ref 14–54)
AST: 33 U/L (ref 15–41)
Albumin: 2.9 g/dL — ABNORMAL LOW (ref 3.5–5.0)
Anion gap: 7 (ref 5–15)
BILIRUBIN TOTAL: 0.4 mg/dL (ref 0.3–1.2)
BUN: 26 mg/dL — AB (ref 6–20)
CO2: 26 mmol/L (ref 22–32)
Calcium: 8.4 mg/dL — ABNORMAL LOW (ref 8.9–10.3)
Chloride: 101 mmol/L (ref 101–111)
Creatinine, Ser: 1.23 mg/dL — ABNORMAL HIGH (ref 0.44–1.00)
GFR, EST AFRICAN AMERICAN: 52 mL/min — AB (ref >60)
GFR, EST NON AFRICAN AMERICAN: 45 mL/min — AB (ref >60)
GLUCOSE: 80 mg/dL (ref 65–99)
POTASSIUM: 4.5 mmol/L (ref 3.5–5.1)
Sodium: 134 mmol/L — ABNORMAL LOW (ref 135–145)
Total Protein: 7.5 g/dL (ref 6.5–8.1)

## 2015-04-15 MED ORDER — LEVETIRACETAM 750 MG PO TABS: 750.0000 mg | ORAL_TABLET | Freq: Two times a day (BID) | ORAL | Status: DC

## 2015-04-15 MED ORDER — LEVETIRACETAM 750 MG PO TABS
750.0000 mg | ORAL_TABLET | Freq: Once | ORAL | Status: AC
  Administered 2015-04-15: 750 mg via ORAL
  Filled 2015-04-15: qty 1

## 2015-04-15 NOTE — Progress Notes (Signed)
CM spoke with pt while in hallway bed to encourage her to stay on the bed.  Pt discussing hot chocolate, her sons. Pt thought she was at [facility] Informed Cm [place] and has three sons.  Pt could not recall coming to ED after having a seizure form [facility]. Pt pleasantly talking about various subjects

## 2015-04-15 NOTE — ED Notes (Signed)
Bed: [facility] Expected date:  Expected time:  Means of arrival:  Comments: EMS 

## 2015-04-15 NOTE — ED Notes (Signed)
Per EMS [facility] of [facility]. Facility noticed pt having seizure "full body movement lasting a minute" was helped to fall. NO LOC or pain.

## 2015-04-15 NOTE — Discharge Instructions (Signed)
Change keppra to 750 mg twice daily. She was given her night dose in the ED today.   Follow up with your doctor.   Return to ER if you have another seizure, passing out.

## 2015-04-15 NOTE — ED Provider Notes (Signed)
CSN: [id number]     Arrival date & time 04/15/15  1431 History   First MD Initiated Contact with Patient 04/15/15 1506     Chief Complaint  Patient presents with  . Seizures     (Consider location/radiation/quality/duration/timing/severity/associated sxs/prior Treatment) The history is provided by the patient.  Roney MarionWanda Grail Drozdowski is a 64 y.o. female hx of dementia, korsakoff syndrome, seizure, here with possible seizure. [facility]. She is currently on Keppra 500 twice a day. The facility noticed that she possibly had a tonic-clonic seizure lasted a minute but she was awake during that time. She was helped to the floor and did not have any head injury. She is sent here per protocol. Upon review of her chart, patient was given Keppra this morning. She was recently evaluated for a cough and abnormal chest x-ray.    Past Medical History  Diagnosis Date  . Alzheimer disease   . Seizures   . Hypertension   . COPD (chronic obstructive pulmonary disease)   . Hepatitis C carrier    History reviewed. No pertinent past surgical history. No family history on file. History  Substance Use Topics  . Smoking status: Never Smoker   . Smokeless tobacco: Not on file  . Alcohol Use: No   OB History    No data available     Review of Systems  Neurological: Positive for seizures.  All other systems reviewed and are negative.     Allergies  Review of patient's allergies indicates no known allergies.  Home Medications   Prior to Admission medications   Medication Sig Start Date End Date Taking? Authorizing Provider  acetaminophen (TYLENOL) 500 MG tablet Take 500 mg by mouth every 4 (four) hours as needed for fever.   Yes Historical Provider, MD  alum & mag hydroxide-simeth (MAALOX/MYLANTA) 200-200-20 MG/5ML suspension Take 30 mLs by mouth every 6 (six) hours as needed for indigestion or heartburn.   Yes Historical Provider, MD  amLODipine (NORVASC) 5 MG  tablet Take 5 mg by mouth daily.   Yes Historical Provider, MD  aspirin 81 MG chewable tablet Chew 81 mg by mouth daily.   Yes Historical Provider, MD  benazepril (LOTENSIN) 10 MG tablet Take 10 mg by mouth daily.   Yes Historical Provider, MD  cholecalciferol (VITAMIN D) 1000 UNITS tablet Take 1,000 Units by mouth daily.   Yes Historical Provider, MD  clopidogrel (PLAVIX) 75 MG tablet Take 75 mg by mouth daily.   Yes Historical Provider, MD  donepezil (ARICEPT) 10 MG tablet Take 10 mg by mouth at bedtime.   Yes Historical Provider, MD  Fluticasone-Salmeterol (ADVAIR) 250-50 MCG/DOSE AEPB Inhale 1 puff into the lungs 2 (two) times daily.   Yes Historical Provider, MD  furosemide (LASIX) 20 MG tablet Take 20 mg by mouth daily.   Yes Historical Provider, MD  guaiFENesin (ROBITUSSIN) 100 MG/5ML SOLN Take 10 mLs by mouth every 6 (six) hours as needed for cough or to loosen phlegm.   Yes Historical Provider, MD  isosorbide mononitrate (IMDUR) 60 MG 24 hr tablet Take 60 mg by mouth daily.   Yes Historical Provider, MD  levETIRAcetam (KEPPRA) 500 MG tablet Take 500 mg by mouth 2 (two) times daily.   Yes Historical Provider, MD  loperamide (IMODIUM) 2 MG capsule Take 2 mg by mouth as needed for diarrhea or loose stools.   Yes Historical Provider, MD  magnesium hydroxide (MILK OF MAGNESIA) 400 MG/5ML suspension Take 30 mLs by  mouth at bedtime as needed for mild constipation.   Yes Historical Provider, MD  Melatonin 3 MG TABS Take 1 tablet by mouth at bedtime.   Yes Historical Provider, MD  memantine (NAMENDA XR) 14 MG CP24 24 hr capsule Take 14 mg by mouth daily.   Yes Historical Provider, MD  metoprolol (LOPRESSOR) 50 MG tablet Take 50 mg by mouth 2 (two) times daily.   Yes Historical Provider, MD  mirtazapine (REMERON) 15 MG tablet Take 15 mg by mouth at bedtime.   Yes Historical Provider, MD  Multiple Vitamins-Minerals (MULTIVITAMIN & MINERAL PO) Take 1 tablet by mouth daily.   Yes Historical Provider, MD   neomycin-bacitracin-polymyxin (NEOSPORIN) [phone] ointment Apply 1 application topically daily as needed.   Yes Historical Provider, MD  ranolazine (RANEXA) 1000 MG SR tablet Take 1,000 mg by mouth 2 (two) times daily.   Yes Historical Provider, MD  thiamine (VITAMIN B-1) 100 MG tablet Take 100 mg by mouth daily.   Yes Historical Provider, MD  tiotropium (SPIRIVA) 18 MCG inhalation capsule Place 18 mcg into inhaler and inhale daily.   Yes Historical Provider, MD  traZODone (DESYREL) 50 MG tablet Take 50 mg by mouth at bedtime.   Yes Historical Provider, MD  VENTOLIN HFA 108 (90 BASE) MCG/ACT inhaler Inhale 1 puff into the lungs every 4 (four) hours as needed for shortness of breath.  03/29/15  Yes Historical Provider, MD   BP 113/66 mmHg  Pulse 63  Temp(Src) 98 F (36.7 C) (Oral)  Resp 16  SpO2 95% Physical Exam  Constitutional:  Chronically ill, awake, alert, demented   HENT:  Head: Normocephalic.  Mouth/Throat: Oropharynx is clear and moist.  Eyes: Conjunctivae and EOM are normal. Pupils are equal, round, and reactive to light.  Neck: Normal range of motion. Neck supple.  Cardiovascular: Normal rate, regular rhythm and normal heart sounds.   Pulmonary/Chest: Effort normal and breath sounds normal.  Abdominal: Soft. Bowel sounds are normal. She exhibits no distension. There is no tenderness. There is no rebound.  Musculoskeletal: Normal range of motion. She exhibits no edema or tenderness.  Neurological: She is alert.  Demented, moving all extremities   Skin: Skin is warm and dry.  Psychiatric:  Unable   Nursing note and vitals reviewed.   ED Course  Procedures (including critical care time) Labs Review Labs Reviewed  CBC WITH DIFFERENTIAL/PLATELET - Abnormal; Notable for the following:    RBC 3.78 (*)    Hemoglobin 10.5 (*)    HCT 33.8 (*)    Neutrophils Relative % 39 (*)    Lymphocytes Relative 51 (*)    All other components within normal limits  COMPREHENSIVE  METABOLIC PANEL - Abnormal; Notable for the following:    Sodium 134 (*)    BUN 26 (*)    Creatinine, Ser 1.23 (*)    Calcium 8.4 (*)    Albumin 2.9 (*)    GFR calc non Af Amer 45 (*)    GFR calc Af Amer 52 (*)    All other components within normal limits  I-STAT TROPOININ, ED    Imaging Review Ct Head Wo Contrast  04/15/2015   CLINICAL DATA:  Seizure.  History of Alzheimer's.  EXAM: CT HEAD WITHOUT CONTRAST  TECHNIQUE: Contiguous axial images were obtained from the base of the skull through the vertex without intravenous contrast.  COMPARISON:  05/18/2012; 03/01/2009  FINDINGS: Similar findings of age advanced atrophy with centralized volume loss and commensurate ex vacuo dilatation of the  ventricular system. Mild diffuse sulcal prominence and prominence of the bifrontal extra-axial spaces. Extensive periventricular hypodensities compatible with microvascular ischemic disease. The Abeyta-white differentiation is otherwise well maintained without CT evidence of acute large territory infarct. No intraparenchymal or extra-axial mass or hemorrhage. Intracranial atherosclerosis. There is underpneumatization of the left sphenoid sinus, unchanged. Remaining paranasal sinuses and mastoid air cells are normally aerated. No air-fluid levels. Regional soft tissues appear normal. No displaced calvarial fracture.  IMPRESSION: Similar findings of atrophy and microvascular ischemic disease without acute intracranial process.   Electronically Signed   By: Simonne ComeJohn  Watts M.D.   On: 04/15/2015 16:26     EKG Interpretation None       EKG: normal EKG, normal sinus rhythm, normal sinus rhythm.    MDM   Final diagnoses:  None   Modena JanskyWanda Grail Wallace CullensGray is a 64 y.o. female here with possible seizure. Will check labs, CT head. Discussed with Dr. Thad Rangereynolds, neurology, who recommend increase keppra 750 mg BID if labs unremarkable.   4:31 PM CT head, labs at baseline. Given keppra 750 mg in the ED. Stable for dc back to  facility.    Richardean Canalavid H Yao, MD 04/15/15 [phone]

## 2015-04-16 ENCOUNTER — Telehealth: Payer: Self-pay | Admitting: *Deleted

## 2015-04-16 NOTE — Telephone Encounter (Signed)
Pharmacy called to inquire about Rx increase dosage.  NCM was able to locate documentation supporting increase of Keppra. 

## 2015-05-04 ENCOUNTER — Ambulatory Visit (INDEPENDENT_AMBULATORY_CARE_PROVIDER_SITE_OTHER): Payer: Medicare Other | Admitting: Neurology

## 2015-05-04 ENCOUNTER — Encounter: Payer: Self-pay | Admitting: Neurology

## 2015-05-04 VITALS — BP 122/72 | HR 61 | Ht 64.5 in | Wt 139.0 lb

## 2015-05-04 DIAGNOSIS — F0390 Unspecified dementia without behavioral disturbance: Secondary | ICD-10-CM

## 2015-05-04 DIAGNOSIS — R569 Unspecified convulsions: Secondary | ICD-10-CM

## 2015-05-04 NOTE — Progress Notes (Signed)
PATIENT: Tanya Rivas DOB: December 23, 1950  Chief Complaint  Patient presents with  . Seizures    Reports being diagnosed with a seizure disorder as a child.  She has not been on anticonvulant therapy in years.  She has not had any problems until her recent seizure on 04/15/15.  She was treated in ED and place on Keppra 750mg , one tablet BID.  She has not had any further episodes since starting this medication.    HISTORICAL  Tanya MarionWanda Grail Severtson is a 64 years old female, right-handed, referred by her primary care physician Dr. Luvenia HellerSamuel Bowman for evaluation of seizure, she is accompanied by her assistant living staff [place].  She had a history of hepatitis C, hypertension, dementia, also long-standing history of seizure, she was not able to provide any meaningful history, she used to live with her daughter, her only child, [facility].  She was taking Keppra 500 mg twice a day, had 2 recurrent seizure, the first one was in April 2016, second one was Apr 15 2015, she was noted to stare into space, followed by whole-body convulsion, last about 10 minutes, post event confusion, she was taken to the hospital, I have reviewed CAT scan of the brain without contrast, generalized atrophy, periventricular small vessel disease.  Laboratory showed elevated creatinine 1.34, hemoglobin 10.5.  She was put on higher dose of Keppra 750 twice a day, tolerating it well, no recurrent seizure,  At baseline, she ambulate without difficulty, able to carry on a conversation, daughter called facility sometimes, she can feed, dress herself without assistance. She is also taking Namenda, Aricept.   REVIEW OF SYSTEMS: Full 14 system review of systems performed and notable only for seizure, snoring, cough, wheezing ALLERGIES: No Known Allergies  HOME MEDICATIONS: Current Outpatient Prescriptions  Medication Sig Dispense Refill  . acetaminophen (TYLENOL) 500 MG  tablet Take 500 mg by mouth every 4 (four) hours as needed for fever.    Marland Kitchen. alum & mag hydroxide-simeth (MAALOX/MYLANTA) 200-200-20 MG/5ML suspension Take 30 mLs by mouth every 6 (six) hours as needed for indigestion or heartburn.    Marland Kitchen. amLODipine (NORVASC) 5 MG tablet Take 5 mg by mouth daily.    Marland Kitchen. aspirin 81 MG chewable tablet Chew 81 mg by mouth daily.    . benazepril (LOTENSIN) 10 MG tablet Take 10 mg by mouth daily.    . cholecalciferol (VITAMIN D) 1000 UNITS tablet Take 1,000 Units by mouth daily.    . clopidogrel (PLAVIX) 75 MG tablet Take 75 mg by mouth daily.    Marland Kitchen. donepezil (ARICEPT) 10 MG tablet Take 10 mg by mouth at bedtime.    . Fluticasone-Salmeterol (ADVAIR) 250-50 MCG/DOSE AEPB Inhale 1 puff into the lungs 2 (two) times daily.    . furosemide (LASIX) 20 MG tablet Take 20 mg by mouth daily.    Marland Kitchen. guaiFENesin (ROBITUSSIN) 100 MG/5ML SOLN Take 10 mLs by mouth every 6 (six) hours as needed for cough or to loosen phlegm.    . isosorbide mononitrate (IMDUR) 60 MG 24 hr tablet Take 60 mg by mouth daily.    Marland Kitchen. levETIRAcetam (KEPPRA) 750 MG tablet Take 1 tablet (750 mg total) by mouth 2 (two) times daily. 60 tablet 0  . loperamide (IMODIUM) 2 MG capsule Take 2 mg by mouth as needed for diarrhea or loose stools.    . magnesium hydroxide (MILK OF MAGNESIA) 400 MG/5ML suspension Take 30 mLs by mouth at bedtime  as needed for mild constipation.    . Melatonin 3 MG TABS Take 1 tablet by mouth at bedtime.    . memantine (NAMENDA XR) 14 MG CP24 24 hr capsule Take 14 mg by mouth daily.    . metoprolol (LOPRESSOR) 50 MG tablet Take 50 mg by mouth 2 (two) times daily.    . mirtazapine (REMERON) 15 MG tablet Take 15 mg by mouth at bedtime.    . Multiple Vitamins-Minerals (MULTIVITAMIN & MINERAL PO) Take 1 tablet by mouth daily.    Marland Kitchen neomycin-bacitracin-polymyxin (NEOSPORIN) [phone] ointment Apply 1 application topically daily as needed.    . ranolazine (RANEXA) 1000 MG SR tablet Take 1,000 mg by mouth  2 (two) times daily.    Marland Kitchen thiamine (VITAMIN B-1) 100 MG tablet Take 100 mg by mouth daily.    Marland Kitchen tiotropium (SPIRIVA) 18 MCG inhalation capsule Place 18 mcg into inhaler and inhale daily.    . traZODone (DESYREL) 50 MG tablet Take 50 mg by mouth at bedtime.    . VENTOLIN HFA 108 (90 BASE) MCG/ACT inhaler Inhale 1 puff into the lungs every 4 (four) hours as needed for shortness of breath.      No current facility-administered medications for this visit.    PAST MEDICAL HISTORY: Past Medical History  Diagnosis Date  . Alzheimer disease   . Seizures   . Hypertension   . COPD (chronic obstructive pulmonary disease)   . Hepatitis C carrier     PAST SURGICAL HISTORY: Past Surgical History  Procedure Laterality Date  . Abdominal hysterectomy      FAMILY HISTORY: No family history on file.  SOCIAL HISTORY:  History   Social History  . Marital Status: Single    Spouse Name: N/A  . Number of Children: 1  . Years of Education: Some HS   Occupational History  . Disabled    Social History Main Topics  . Smoking status: Never Smoker   . Smokeless tobacco: Not on file  . Alcohol Use: No  . Drug Use: No  . Sexual Activity: Not on file   Other Topics Concern  . Not on file   Social History Narrative   Lives at [facility].   Right-handed.   Occasional caffeine use.     PHYSICAL EXAM   Filed Vitals:   05/04/15 1039  BP: 122/72  Pulse: 61  Height: 5' 4.5" (1.638 m)  Weight: 139 lb (63.05 kg)    Not recorded      Body mass index is 23.5 kg/(m^2).  PHYSICAL EXAMNIATION:  Gen: NAD, conversant, well nourised, obese, well groomed                     Cardiovascular: Regular rate rhythm, no peripheral edema, warm, nontender. Eyes: Conjunctivae clear without exudates or hemorrhage Neck: Supple, no carotid bruise. Pulmonary: Clear to auscultation bilaterally   NEUROLOGICAL EXAM:  MENTAL STATUS: Speech/cognition: Follow commands, no  expressive aphasia, Mini-Mental Status Examination is 9 out of 30, she is not oriented to time, place, missed 3 out of 3 recalls, has difficulty spelling month backwards, difficulty copy design, write a sentence,  CRANIAL NERVES: CN II: Visual fields are full to confrontation. Fundoscopic exam disc was sharp, pupils were equal round reactive to light CN III, IV, VI: extraocular movement are normal. No ptosis. CN V: Facial sensation is intact to pinprick in all 3 divisions bilaterally. Corneal responses are intact.  CN VII: Face is symmetric with normal  eye closure and smile. CN VIII: Hearing is normal to rubbing fingers CN IX, X: Palate elevates symmetrically. Phonation is normal. CN XI: Head turning and shoulder shrug are intact CN XII: Tongue is midline with normal movements and no atrophy.  MOTOR: There is no pronator drift of out-stretched arms. Muscle bulk and tone are normal. Muscle strength is normal.  REFLEXES: Reflexes are 2+ and symmetric at the biceps, triceps, knees, and ankles. Plantar responses are flexor.  SENSORY: Light touch, pinprick, position sense, and vibration sense are intact in fingers and toes.  COORDINATION: Rapid alternating movements and fine finger movements are intact. There is no dysmetria on finger-to-nose and heel-knee-shin. There are no abnormal or extraneous movements.   GAIT/STANCE: Posture is normal. Gait is steady with normal steps, base, arm swing, and turning. Heel and toe walking are normal. Tandem gait is normal.  Romberg is absent.   DIAGNOSTIC DATA (LABS, IMAGING, TESTING) - I reviewed patient records, labs, notes, testing and imaging myself where available.  Lab Results  Component Value Date   WBC 5.0 04/15/2015   HGB 10.5* 04/15/2015   HCT 33.8* 04/15/2015   MCV 89.4 04/15/2015   PLT 178 04/15/2015      Component Value Date/Time   NA 134* 04/15/2015 1528   NA 134* 05/18/2012 1645   K 4.5 04/15/2015 1528   K 3.9 05/18/2012 1645    CL 101 04/15/2015 1528   CL 101 05/18/2012 1645   CO2 26 04/15/2015 1528   CO2 27 05/18/2012 1645   GLUCOSE 80 04/15/2015 1528   GLUCOSE 86 05/18/2012 1645   BUN 26* 04/15/2015 1528   BUN 14 05/18/2012 1645   CREATININE 1.23* 04/15/2015 1528   CREATININE 1.14 05/18/2012 1645   CALCIUM 8.4* 04/15/2015 1528   CALCIUM 8.9 05/18/2012 1645   PROT 7.5 04/15/2015 1528   PROT 8.3* 05/18/2012 1645   ALBUMIN 2.9* 04/15/2015 1528   ALBUMIN 3.2* 05/18/2012 1645   AST 33 04/15/2015 1528   AST 48* 05/18/2012 1645   ALT 29 04/15/2015 1528   ALT 44 05/18/2012 1645   ALKPHOS 67 04/15/2015 1528   ALKPHOS 120 05/18/2012 1645   BILITOT 0.4 04/15/2015 1528   GFRNONAA 45* 04/15/2015 1528   GFRNONAA 51* 05/18/2012 1645   GFRAA 52* 04/15/2015 1528   GFRAA 60* 05/18/2012 1645   ASSESSMENT AND PLAN  Yariah Grail Brewbaker is a 64 y.o. female  is a 64 years old African-American female, presenting with gradual onset memory trouble, poor historian, history of seizure, recurrent seizure while taking Keppra 500 mg twice a day, no recurrent seizures since dose was increased to 750 twice a day  1. Complex partial seizure with secondary generalization, complete evaluation with MRI of the brain with and without contrast, EEG keep current dose of Keppra 750 twice a day 2. Dementia, Mini-Mental Status Examination is only 9 out of 30 today, Laboratory evaluation to rule out treatable etiology, keep Namenda, Aricept 3.  return to clinic in one month    Levert Feinstein, M.D. Ph.D.  [facility] [address], [address] [place], [place] [address] Ph: [phone] Fax: [phone]

## 2015-05-05 ENCOUNTER — Telehealth: Payer: Self-pay | Admitting: *Deleted

## 2015-05-05 LAB — RPR: RPR Ser Ql: NONREACTIVE

## 2015-05-05 LAB — HIV ANTIBODY (ROUTINE TESTING W REFLEX): HIV Screen 4th Generation wRfx: NONREACTIVE

## 2015-05-05 LAB — VITAMIN B12: VITAMIN B 12: 607 pg/mL (ref 211–946)

## 2015-05-05 LAB — TSH: TSH: 0.8 u[IU]/mL (ref 0.450–4.500)

## 2015-05-05 NOTE — Telephone Encounter (Signed)
Lab results faxed and confirmed to [facility], Attention: Tammy at [phone].

## 2015-05-05 NOTE — Telephone Encounter (Signed)
-----   Message from Yijun Yan, MD sent at 05/05/2015  8:44 AM EDT ----- Please call patient for normal laboratory result 

## 2015-05-18 ENCOUNTER — Ambulatory Visit (INDEPENDENT_AMBULATORY_CARE_PROVIDER_SITE_OTHER): Payer: Medicare Other | Admitting: Neurology

## 2015-05-18 ENCOUNTER — Ambulatory Visit
Admission: RE | Admit: 2015-05-18 | Discharge: 2015-05-18 | Disposition: A | Discharge status: Home / Self Care | Payer: Medicare Other | Source: Ambulatory Visit | Attending: Neurology | Admitting: Neurology

## 2015-05-18 DIAGNOSIS — R569 Unspecified convulsions: Secondary | ICD-10-CM

## 2015-05-18 DIAGNOSIS — F0390 Unspecified dementia without behavioral disturbance: Secondary | ICD-10-CM

## 2015-05-20 ENCOUNTER — Telehealth: Payer: Self-pay | Admitting: Neurology

## 2015-05-20 NOTE — Procedures (Signed)
   HISTORY:  64 year old female, with history of seizure, dementia  TECHNIQUE:  16 channel EEG was performed based on standard 10-16 international system. One channel was dedicated to EKG, which has demonstrates irregular rhythm of 60  beats per minutes.  Upon awakening, the posterior background activity was dysarrhythmic, very low amplitude, mixed theta, delta range activity.  There was no evidence of epileptiform discharge.  Photic stimulation was performed, there was continued evidence of symmetric bilateral background slowing.   Hyperventilation was attempted, patient was not able to participate.   No sleep was achieved.  CONCLUSION: This is an abnormal awake EEG, there is evidence of moderate background slowing, indicate by cerebral malfunction. Common etiology are advanced dementia, metabolic toxic. There was no evidence of epileptiform discharges.

## 2015-05-20 NOTE — Telephone Encounter (Signed)
Tanya Rivas   Please call patient, brain MRI showed atrophy small vessel disease,, consistent with her complaints of seizure, dementia, will go over detail at her next follow-up visit,

## 2015-05-20 NOTE — Telephone Encounter (Signed)
Spoke to [employer], rn at [facility] - she will provide these results to her attending physician to review with patient and family.  Faxed results to [phone].

## 2015-06-08 ENCOUNTER — Telehealth: Payer: Self-pay | Admitting: Neurology

## 2015-06-08 ENCOUNTER — Ambulatory Visit (INDEPENDENT_AMBULATORY_CARE_PROVIDER_SITE_OTHER): Payer: Medicare Other | Admitting: Neurology

## 2015-06-08 ENCOUNTER — Encounter: Payer: Self-pay | Admitting: Neurology

## 2015-06-08 VITALS — BP 111/67 | HR 58 | Ht 64.5 in | Wt 142.0 lb

## 2015-06-08 DIAGNOSIS — F0390 Unspecified dementia without behavioral disturbance: Secondary | ICD-10-CM

## 2015-06-08 DIAGNOSIS — R569 Unspecified convulsions: Secondary | ICD-10-CM

## 2015-06-08 MED ORDER — LEVETIRACETAM 750 MG PO TABS: ORAL_TABLET | ORAL | Status: DC

## 2015-06-08 MED ORDER — MEMANTINE HCL 10 MG PO TABS: 10.0000 mg | ORAL_TABLET | Freq: Two times a day (BID) | ORAL | Status: DC

## 2015-06-08 MED ORDER — LEVETIRACETAM 750 MG PO TABS
ORAL_TABLET | ORAL | Status: DC
Start: 2015-06-08 — End: 2016-06-23

## 2015-06-08 NOTE — Telephone Encounter (Signed)
Patient was seen today.  OV note says: keep current dose of Aricept 10 mg daily, add on Namenda 10 mg twice a day  I called back.  Spoke with Delice Bisonara.  Relayed info from OV.  They will proceed with rx as ordered and will call back if anything further is needed.

## 2015-06-08 NOTE — Telephone Encounter (Signed)
Tanya Rivas with [facility] called inquiring about memantine (NAMENDA) 10 MG tablet .Patient is currently on Namenda XR 14mg  1 daily. She is inquiring if there is a change in strength of medication. She can be reached at [phone].

## 2015-06-08 NOTE — Progress Notes (Signed)
Chief Complaint  Patient presents with  . Seizures    [facility]  Reports Tanya Rivas had a seizure on 06/06/15. She did not miss any medication doses. She would like the results of her MRI and EEG results.  . Dementia    MMSE 8/30 - 2 animals      PATIENT: Tanya MarionWanda Grail Rivas DOB: 02-Aug-1951  Chief Complaint  Patient presents with  . Seizures    [facility] is [facility].  Reports Tanya Rivas had a seizure on 06/06/15. She did not miss any medication doses. She would like the results of her MRI and EEG results.  . Dementia    MMSE 8/30 - 2 animals    HISTORICAL  Tanya JanskyWanda Grail Wallace Rivas is a 64 years old female, right-handed, referred by her primary care physician Dr. Luvenia HellerSamuel Bowman for evaluation of seizure, she is accompanied by her assistant living [place].  She had a history of hepatitis C, hypertension, dementia, also long-standing history of seizure, she was not able to provide any meaningful history, she used to live with her daughter, her only child, [facility] since April 2016.  She was taking Keppra 500 mg twice a day, had 2 recurrent seizure, the first one was in April 2016, second one was Apr 15 2015, she was noted to stare into space, followed by whole-body convulsion, last about 10 minutes, post event confusion, she was taken to the hospital, I have reviewed CAT scan of the brain without contrast, generalized atrophy, periventricular small vessel disease.  Laboratory showed elevated creatinine 1.34, hemoglobin 10.5.  She was put on higher dose of Keppra 750 twice a day, tolerating it well, no recurrent seizure,  At baseline, she ambulate without difficulty, able to carry on a conversation, daughter called facility sometimes, she can feed, dress herself without assistance. She is also taking Namenda, Aricept.   UPDATE June 08 2015: She came in with her caregiver Tanya [facility]Kiana at today's clinical visit, she  continues to have recurrent seizure, but neither patient or Tanya SharpsKiana could give me detail on how frequent, or how long each episode last. She is taking Keppra 750 mg twice a day, continue have worsening memory trouble.  Mini-Mental Status Examination is only 8 out of 30 today, she is on Aricept 10 mg daily, Namenda xr 14 mg every day  I have personally reviewed MRI of the brain, moderate atrophy, mesial temporal lobe atrophy, mild periventricular small vessel disease, EEG, generalized moderate background slowing  REVIEW OF SYSTEMS: Full 14 system review of systems performed and notable only for seizure ALLERGIES: No Known Allergies  HOME MEDICATIONS: Current Outpatient Prescriptions  Medication Sig Dispense Refill  . amLODipine (NORVASC) 5 MG tablet Take 5 mg by mouth daily.    Marland Kitchen. aspirin 81 MG chewable tablet Chew 81 mg by mouth daily.    . benazepril (LOTENSIN) 10 MG tablet Take 10 mg by mouth daily.    . cholecalciferol (VITAMIN D) 1000 UNITS tablet Take 1,000 Units by mouth daily.    . clopidogrel (PLAVIX) 75 MG tablet Take 75 mg by mouth daily.    Marland Kitchen. donepezil (ARICEPT) 10 MG tablet Take 10 mg by mouth at bedtime.    . Fluticasone-Salmeterol (ADVAIR) 250-50 MCG/DOSE AEPB Inhale 1 puff into the lungs 2 (two) times daily.    . furosemide (LASIX) 20 MG tablet Take 20 mg by mouth daily.    Marland Kitchen. guaiFENesin (ROBITUSSIN) 100  MG/5ML SOLN Take 10 mLs by mouth every 6 (six) hours as needed for cough or to loosen phlegm.    . isosorbide mononitrate (IMDUR) 60 MG 24 hr tablet Take 60 mg by mouth daily.    Marland Kitchen levETIRAcetam (KEPPRA) 750 MG tablet Take 1 tablet (750 mg total) by mouth 2 (two) times daily. 60 tablet 0  . loperamide (IMODIUM) 2 MG capsule Take 2 mg by mouth as needed for diarrhea or loose stools.    . magnesium hydroxide (MILK OF MAGNESIA) 400 MG/5ML suspension Take 30 mLs by mouth at bedtime as needed for mild constipation.    . Melatonin 3 MG TABS Take 1 tablet by mouth at bedtime.    .  memantine (NAMENDA XR) 14 MG CP24 24 hr capsule Take 14 mg by mouth daily.    . metoprolol (LOPRESSOR) 50 MG tablet Take 50 mg by mouth 2 (two) times daily.    . mirtazapine (REMERON) 15 MG tablet Take 15 mg by mouth at bedtime.    . Multiple Vitamins-Minerals (MULTIVITAMIN & MINERAL PO) Take 1 tablet by mouth daily.    Marland Kitchen neomycin-bacitracin-polymyxin (NEOSPORIN) [phone] ointment Apply 1 application topically daily as needed.    . ranolazine (RANEXA) 1000 MG SR tablet Take 1,000 mg by mouth 2 (two) times daily.    Marland Kitchen thiamine (VITAMIN B-1) 100 MG tablet Take 100 mg by mouth daily.    Marland Kitchen tiotropium (SPIRIVA) 18 MCG inhalation capsule Place 18 mcg into inhaler and inhale daily.    . traZODone (DESYREL) 50 MG tablet Take 50 mg by mouth at bedtime.    . VENTOLIN HFA 108 (90 BASE) MCG/ACT inhaler Inhale 1 puff into the lungs every 4 (four) hours as needed for shortness of breath.      No current facility-administered medications for this visit.    PAST MEDICAL HISTORY: Past Medical History  Diagnosis Date  . Alzheimer disease   . Seizures   . Hypertension   . COPD (chronic obstructive pulmonary disease)   . Hepatitis C carrier     PAST SURGICAL HISTORY: Past Surgical History  Procedure Laterality Date  . Abdominal hysterectomy      FAMILY HISTORY: No family history on file.  SOCIAL HISTORY:  History   Social History  . Marital Status: Single    Spouse Name: N/A  . Number of Children: 1  . Years of Education: Some HS   Occupational History  . Disabled    Social History Main Topics  . Smoking status: Never Smoker   . Smokeless tobacco: Not on file  . Alcohol Use: No  . Drug Use: No  . Sexual Activity: Not on file   Other Topics Concern  . Not on file   Social History Narrative   Lives at [facility].   Right-handed.   Occasional caffeine use.     PHYSICAL EXAM   Filed Vitals:   06/08/15 1327  BP: 111/67  Pulse: 58  Height: 5' 4.5"  (1.638 m)  Weight: 142 lb (64.411 kg)    Not recorded      Body mass index is 24.01 kg/(m^2).  PHYSICAL EXAMNIATION:  Gen: NAD, conversant, well nourised, obese, well groomed                     Cardiovascular: Regular rate rhythm, no peripheral edema, warm, nontender. Eyes: Conjunctivae clear without exudates or hemorrhage Neck: Supple, no carotid bruise. Pulmonary: Clear to auscultation bilaterally   NEUROLOGICAL EXAM:  MENTAL STATUS: Speech/cognition: Follow commands, no expressive aphasia, Mini-Mental Status Examination is 8 out of 30, she is not oriented to time, place, missed 3 out of 3 recalls, has difficulty spelling month backwards, difficulty copy design, write a sentence,  CRANIAL NERVES: CN II: Visual fields are full to confrontation. Pupils were equal round reactive to light CN III, IV, VI: extraocular movement are normal. No ptosis. CN V: Facial sensation is intact to pinprick in all 3 divisions bilaterally. Corneal responses are intact.  CN VII: Face is symmetric with normal eye closure and smile. CN VIII: Hearing is normal to rubbing fingers CN IX, X: Palate elevates symmetrically. Phonation is normal. CN XI: Head turning and shoulder shrug are intact CN XII: Tongue is midline with normal movements and no atrophy.  MOTOR: There is no pronator drift of out-stretched arms. Muscle bulk and tone are normal. Muscle strength is normal.  REFLEXES: Reflexes are 2+ and symmetric at the biceps, triceps, knees, and ankles. Plantar responses are flexor.  SENSORY: Light touch, pinprick, position sense, and vibration sense are intact in fingers and toes.  COORDINATION: Rapid alternating movements and fine finger movements are intact. There is no dysmetria on finger-to-nose and heel-knee-shin. There are no abnormal or extraneous movements.   GAIT/STANCE:  wide based, mildly unsteady   DIAGNOSTIC DATA (LABS, IMAGING, TESTING) - I reviewed patient records, labs,  notes, testing and imaging myself where available.  Lab Results  Component Value Date   WBC 5.0 04/15/2015   HGB 10.5* 04/15/2015   HCT 33.8* 04/15/2015   MCV 89.4 04/15/2015   PLT 178 04/15/2015      Component Value Date/Time   NA 134* 04/15/2015 1528   NA 134* 05/18/2012 1645   K 4.5 04/15/2015 1528   K 3.9 05/18/2012 1645   CL 101 04/15/2015 1528   CL 101 05/18/2012 1645   CO2 26 04/15/2015 1528   CO2 27 05/18/2012 1645   GLUCOSE 80 04/15/2015 1528   GLUCOSE 86 05/18/2012 1645   BUN 26* 04/15/2015 1528   BUN 14 05/18/2012 1645   CREATININE 1.23* 04/15/2015 1528   CREATININE 1.14 05/18/2012 1645   CALCIUM 8.4* 04/15/2015 1528   CALCIUM 8.9 05/18/2012 1645   PROT 7.5 04/15/2015 1528   PROT 8.3* 05/18/2012 1645   ALBUMIN 2.9* 04/15/2015 1528   ALBUMIN 3.2* 05/18/2012 1645   AST 33 04/15/2015 1528   AST 48* 05/18/2012 1645   ALT 29 04/15/2015 1528   ALT 44 05/18/2012 1645   ALKPHOS 67 04/15/2015 1528   ALKPHOS 120 05/18/2012 1645   BILITOT 0.4 04/15/2015 1528   GFRNONAA 45* 04/15/2015 1528   GFRNONAA 51* 05/18/2012 1645   GFRAA 52* 04/15/2015 1528   GFRAA 60* 05/18/2012 1645   ASSESSMENT AND PLAN  Brittish Grail Stalling is a 64 y.o. female    1. Complex partial seizure with secondary generalization,  still has recurrent seizure while taking Keppra 750 mg twice a day, will increase the dosage to 1 tablet the morning, 2 tablets at night document or event 2, dementia, central nervous system degenerative disorder, likely Alzheimer's disease, her multiple medical conditions, likely contributes as well, keep current dose of Aricept 10 mg daily, add on Namenda 10 mg twice a day    Levert Feinstein, M.D. Ph.D.  [facility] [address] [place], [place] [address] Ph: [phone] Fax: [phone]

## 2015-09-09 ENCOUNTER — Ambulatory Visit: Payer: Medicare Other | Admitting: Nurse Practitioner

## 2015-10-14 ENCOUNTER — Ambulatory Visit: Payer: Medicare Other | Admitting: Nurse Practitioner

## 2015-10-26 ENCOUNTER — Ambulatory Visit: Payer: Medicare Other | Admitting: Nurse Practitioner

## 2015-12-25 ENCOUNTER — Encounter (HOSPITAL_COMMUNITY): Payer: Self-pay

## 2015-12-25 ENCOUNTER — Emergency Department (HOSPITAL_COMMUNITY)
Admission: EM | Admit: 2015-12-25 | Discharge: 2015-12-25 | Disposition: A | Discharge status: Home / Self Care | Payer: Medicare Other | Attending: Emergency Medicine | Admitting: Emergency Medicine

## 2015-12-25 ENCOUNTER — Emergency Department (HOSPITAL_COMMUNITY): Payer: Medicare Other

## 2015-12-25 DIAGNOSIS — J449 Chronic obstructive pulmonary disease, unspecified: Secondary | ICD-10-CM | POA: Diagnosis not present

## 2015-12-25 DIAGNOSIS — I10 Essential (primary) hypertension: Secondary | ICD-10-CM | POA: Diagnosis not present

## 2015-12-25 DIAGNOSIS — Z79899 Other long term (current) drug therapy: Secondary | ICD-10-CM | POA: Diagnosis not present

## 2015-12-25 DIAGNOSIS — G309 Alzheimer's disease, unspecified: Secondary | ICD-10-CM | POA: Diagnosis not present

## 2015-12-25 DIAGNOSIS — R569 Unspecified convulsions: Secondary | ICD-10-CM | POA: Diagnosis present

## 2015-12-25 DIAGNOSIS — Z7982 Long term (current) use of aspirin: Secondary | ICD-10-CM | POA: Insufficient documentation

## 2015-12-25 DIAGNOSIS — Z7951 Long term (current) use of inhaled steroids: Secondary | ICD-10-CM | POA: Insufficient documentation

## 2015-12-25 DIAGNOSIS — Z8619 Personal history of other infectious and parasitic diseases: Secondary | ICD-10-CM | POA: Insufficient documentation

## 2015-12-25 DIAGNOSIS — F0280 Dementia in other diseases classified elsewhere without behavioral disturbance: Secondary | ICD-10-CM | POA: Diagnosis not present

## 2015-12-25 LAB — BASIC METABOLIC PANEL
Anion gap: 8 (ref 5–15)
BUN: 17 mg/dL (ref 6–20)
CALCIUM: 9.1 mg/dL (ref 8.9–10.3)
CO2: 25 mmol/L (ref 22–32)
Chloride: 104 mmol/L (ref 101–111)
Creatinine, Ser: 1.22 mg/dL — ABNORMAL HIGH (ref 0.44–1.00)
GFR calc Af Amer: 52 mL/min — ABNORMAL LOW (ref >60)
GFR, EST NON AFRICAN AMERICAN: 45 mL/min — AB (ref >60)
Glucose, Bld: 90 mg/dL (ref 65–99)
Potassium: 4 mmol/L (ref 3.5–5.1)
Sodium: 137 mmol/L (ref 135–145)

## 2015-12-25 LAB — CBC WITH DIFFERENTIAL/PLATELET
Basophils Absolute: 0 10*3/uL (ref 0.0–0.1)
Basophils Relative: 0 %
EOS PCT: 4 %
Eosinophils Absolute: 0.2 10*3/uL (ref 0.0–0.7)
HEMATOCRIT: 36.9 % (ref 36.0–46.0)
Hemoglobin: 11.8 g/dL — ABNORMAL LOW (ref 12.0–15.0)
LYMPHS ABS: 2.2 10*3/uL (ref 0.7–4.0)
LYMPHS PCT: 39 %
MCH: 27.3 pg (ref 26.0–34.0)
MCHC: 32 g/dL (ref 30.0–36.0)
MCV: 85.2 fL (ref 78.0–100.0)
MONOS PCT: 11 %
Monocytes Absolute: 0.6 10*3/uL (ref 0.1–1.0)
Neutro Abs: 2.6 10*3/uL (ref 1.7–7.7)
Neutrophils Relative %: 46 %
PLATELETS: 209 10*3/uL (ref 150–400)
RBC: 4.33 MIL/uL (ref 3.87–5.11)
RDW: 13.6 % (ref 11.5–15.5)
WBC: 5.6 10*3/uL (ref 4.0–10.5)

## 2015-12-25 LAB — CBG MONITORING, ED: Glucose-Capillary: 87 mg/dL (ref 65–99)

## 2015-12-25 MED ORDER — LORAZEPAM 2 MG/ML IJ SOLN
1.0000 mg | Freq: Once | INTRAMUSCULAR | Status: AC
  Administered 2015-12-25: 1 mg via INTRAVENOUS
  Filled 2015-12-25: qty 1

## 2015-12-25 NOTE — ED Notes (Signed)
Pt cbg 87. 

## 2015-12-25 NOTE — Discharge Instructions (Signed)
History of seizures one episode of seizure activity here very brief lasting 10 seconds. Workup without any acute abnormalities. Head CT CT of neck and x-rays of pelvis and hips all negative. No evidence of any trauma related to the fall. Continue her Keppra.

## 2015-12-25 NOTE — ED Notes (Signed)
[facility] called  

## 2015-12-25 NOTE — ED Notes (Addendum)
Pt. Lives at [facility] and had a seizure in front of another  Alzheimers pt.   When paramedics arrived pt. Had another 30 sec full body seizure.  It is unknown if pt. Was injured , no visible injuries not . No incontinence noted.  No tongue injuries noted.  Paramedics reports that the staff at [facility] was unable to report pt.s baseline .  Pt. Has no hx of seizure.  She is alert to self but not time or place.  Very pleasant.

## 2015-12-25 NOTE — ED Provider Notes (Signed)
CSN: [id number]     Arrival date & time 12/25/15  0757 History   First MD Initiated Contact with Patient 12/25/15 [phone]     Chief Complaint  Patient presents with  . Seizures     (Consider location/radiation/quality/duration/timing/severity/associated sxs/prior Treatment) Patient is a 65 y.o. female presenting with seizures. The history is provided by the patient. The history is limited by the condition of the patient.  Seizures  patient sent in from nursing facility. Patient found on the side of her bed on the floor. Presumed fall. Paramedics also witnessed a 32nd for body seizure. Patient has a history of seizures and is on Keppra for that. Patient referred in for evaluation. Patient has significant dementia Alzheimer's disease. Patient is also on Ativan.  Past Medical History  Diagnosis Date  . Alzheimer disease   . Seizures (HCC)   . Hypertension   . COPD (chronic obstructive pulmonary disease) (HCC)   . Hepatitis C carrier    Past Surgical History  Procedure Laterality Date  . Abdominal hysterectomy     No family history on file. Social History  Substance Use Topics  . Smoking status: Never Smoker   . Smokeless tobacco: None  . Alcohol Use: No   OB History    No data available     Review of Systems  Unable to perform ROS: Dementia  Neurological: Positive for seizures.      Allergies  Review of patient's allergies indicates no known allergies.  Home Medications   Prior to Admission medications   Medication Sig Start Date End Date Taking? Authorizing Provider  acetaminophen (TYLENOL) 500 MG tablet Take 500 mg by mouth every 6 (six) hours as needed for moderate pain.   Yes Historical Provider, MD  amLODipine (NORVASC) 5 MG tablet Take 5 mg by mouth daily.   Yes Historical Provider, MD  aspirin 81 MG chewable tablet Chew 81 mg by mouth daily.   Yes Historical Provider, MD  benazepril (LOTENSIN) 10 MG tablet Take 10 mg by mouth daily.   Yes Historical Provider, MD   cholecalciferol (VITAMIN D) 1000 UNITS tablet Take 1,000 Units by mouth daily.   Yes Historical Provider, MD  clopidogrel (PLAVIX) 75 MG tablet Take 75 mg by mouth daily.   Yes Historical Provider, MD  donepezil (ARICEPT) 10 MG tablet Take 10 mg by mouth at bedtime.   Yes Historical Provider, MD  Fluticasone-Salmeterol (ADVAIR) 250-50 MCG/DOSE AEPB Inhale 1 puff into the lungs 2 (two) times daily.   Yes Historical Provider, MD  furosemide (LASIX) 20 MG tablet Take 20 mg by mouth daily.   Yes Historical Provider, MD  guaiFENesin (ROBITUSSIN) 100 MG/5ML SOLN Take 10 mLs by mouth every 6 (six) hours as needed for cough or to loosen phlegm.   Yes Historical Provider, MD  isosorbide mononitrate (IMDUR) 60 MG 24 hr tablet Take 60 mg by mouth daily.   Yes Historical Provider, MD  levETIRAcetam (KEPPRA) 750 MG tablet One in am and 2 tabs po qhs Patient taking differently: Take 750-1,500 mg by mouth 2 (two) times daily. One in am and 2 tabs po qhs 06/08/15  Yes Levert Feinstein, MD  LORazepam (ATIVAN) 0.5 MG tablet Take 0.5 mg by mouth 2 (two) times daily.   Yes Historical Provider, MD  Melatonin 3 MG TABS Take 1 tablet by mouth at bedtime.   Yes Historical Provider, MD  metoprolol (LOPRESSOR) 50 MG tablet Take 50 mg by mouth 2 (two) times daily.   Yes Historical Provider, MD  mirtazapine (REMERON) 15 MG tablet Take 15 mg by mouth at bedtime.   Yes Historical Provider, MD  Multiple Vitamins-Minerals (MULTIVITAMIN & MINERAL PO) Take 1 tablet by mouth daily.   Yes Historical Provider, MD  QUEtiapine (SEROQUEL) 50 MG tablet Take 50 mg by mouth 2 (two) times daily.   Yes Historical Provider, MD  ranolazine (RANEXA) 1000 MG SR tablet Take 1,000 mg by mouth 2 (two) times daily.   Yes Historical Provider, MD  thiamine (VITAMIN B-1) 100 MG tablet Take 100 mg by mouth daily.   Yes Historical Provider, MD  tiotropium (SPIRIVA) 18 MCG inhalation capsule Place 18 mcg into inhaler and inhale daily.   Yes Historical Provider,  MD  traZODone (DESYREL) 50 MG tablet Take 50 mg by mouth at bedtime.   Yes Historical Provider, MD  loperamide (IMODIUM) 2 MG capsule Take 2 mg by mouth as needed for diarrhea or loose stools.    Historical Provider, MD  magnesium hydroxide (MILK OF MAGNESIA) 400 MG/5ML suspension Take 30 mLs by mouth at bedtime as needed for mild constipation.    Historical Provider, MD  memantine (NAMENDA) 10 MG tablet Take 1 tablet (10 mg total) by mouth 2 (two) times daily. 06/08/15   Levert Feinstein, MD  neomycin-bacitracin-polymyxin (NEOSPORIN) [phone] ointment Apply 1 application topically daily as needed.    Historical Provider, MD  VENTOLIN HFA 108 (90 BASE) MCG/ACT inhaler Inhale 1 puff into the lungs every 4 (four) hours as needed for shortness of breath.  03/29/15   Historical Provider, MD   BP 145/69 mmHg  Pulse 61  Resp 22  SpO2 99% Physical Exam  Constitutional: She appears well-developed and well-nourished. No distress.  HENT:  Head: Normocephalic and atraumatic.  Mouth/Throat: Oropharynx is clear and moist.  Eyes: Conjunctivae and EOM are normal. Pupils are equal, round, and reactive to light.  Neck: Normal range of motion. Neck supple.  Cardiovascular: Normal rate, regular rhythm and normal heart sounds.   No murmur heard. Pulmonary/Chest: Breath sounds normal. No respiratory distress.  Abdominal: Soft. Bowel sounds are normal. There is no tenderness.  Musculoskeletal: Normal range of motion. She exhibits no edema.  Neurological: She is alert. No cranial nerve deficit. Coordination normal.  Skin: Skin is warm.    ED Course  Procedures (including critical care time) Labs Review Labs Reviewed  CBC WITH DIFFERENTIAL/PLATELET - Abnormal; Notable for the following:    Hemoglobin 11.8 (*)    All other components within normal limits  BASIC METABOLIC PANEL - Abnormal; Notable for the following:    Creatinine, Ser 1.22 (*)    GFR calc non Af Amer 45 (*)    GFR calc Af Amer 52 (*)    All  other components within normal limits  CBG MONITORING, ED   Results for orders placed or performed during the hospital encounter of 12/25/15  CBC with Differential/Platelet  Result Value Ref Range   WBC 5.6 4.0 - 10.5 K/uL   RBC 4.33 3.87 - 5.11 MIL/uL   Hemoglobin 11.8 (L) 12.0 - 15.0 g/dL   HCT 16.1 09.6 - 04.5 %   MCV 85.2 78.0 - 100.0 fL   MCH 27.3 26.0 - 34.0 pg   MCHC 32.0 30.0 - 36.0 g/dL   RDW 40.9 81.1 - 91.4 %   Platelets 209 150 - 400 K/uL   Neutrophils Relative % 46 %   Neutro Abs 2.6 1.7 - 7.7 K/uL   Lymphocytes Relative 39 %   Lymphs Abs 2.2 0.7 -  4.0 K/uL   Monocytes Relative 11 %   Monocytes Absolute 0.6 0.1 - 1.0 K/uL   Eosinophils Relative 4 %   Eosinophils Absolute 0.2 0.0 - 0.7 K/uL   Basophils Relative 0 %   Basophils Absolute 0.0 0.0 - 0.1 K/uL  Basic metabolic panel  Result Value Ref Range   Sodium 137 135 - 145 mmol/L   Potassium 4.0 3.5 - 5.1 mmol/L   Chloride 104 101 - 111 mmol/L   CO2 25 22 - 32 mmol/L   Glucose, Bld 90 65 - 99 mg/dL   BUN 17 6 - 20 mg/dL   Creatinine, Ser 1.61 (H) 0.44 - 1.00 mg/dL   Calcium 9.1 8.9 - 09.6 mg/dL   GFR calc non Af Amer 45 (L) >60 mL/min   GFR calc Af Amer 52 (L) >60 mL/min   Anion gap 8 5 - 15  CBG monitoring, ED  Result Value Ref Range   Glucose-Capillary 87 65 - 99 mg/dL     Imaging Review Ct Head Wo Contrast  12/25/2015  CLINICAL DATA:  Recent seizure activity EXAM: CT HEAD WITHOUT CONTRAST CT CERVICAL SPINE WITHOUT CONTRAST TECHNIQUE: Multidetector CT imaging of the head and cervical spine was performed following the standard protocol without intravenous contrast. Multiplanar CT image reconstructions of the cervical spine were also generated. COMPARISON:  None. FINDINGS: CT HEAD FINDINGS The bony calvarium is intact. Mild air-fluid levels are noted within the maxillary antra bilaterally. Diffuse atrophic changes are noted. Scattered areas of decreased attenuation are noted consistent with chronic white  matter ischemic change. No findings to suggest acute hemorrhage, acute infarction or space-occupying mass lesion are noted. CT CERVICAL SPINE FINDINGS Seven cervical segments are well visualized. Vertebral body height is well maintained. Disc space narrowing is noted at C5-6 with endplate sclerosis. Very mild osteophytic changes are seen. No acute fracture or acute facet abnormality is noted. The odontoid is within normal limits. No significant soft tissue abnormality is noted. The lung apices demonstrate significant density bilaterally. This is worse on the right than the left. This is likely chronic in nature although an acute infiltrative density could not be totally excluded. IMPRESSION: CT of the head: Chronic atrophic and ischemic changes without acute abnormality. Air-fluid levels within the maxillary antra bilaterally. CT of the cervical spine: Degenerative changes without acute bony abnormality. Changes in the lung apices likely of a chronic nature although no prior examination is available for comparison. Electronically Signed   By: Alcide Clever M.D.   On: 12/25/2015 09:12   Ct Cervical Spine Wo Contrast  12/25/2015  CLINICAL DATA:  Recent seizure activity EXAM: CT HEAD WITHOUT CONTRAST CT CERVICAL SPINE WITHOUT CONTRAST TECHNIQUE: Multidetector CT imaging of the head and cervical spine was performed following the standard protocol without intravenous contrast. Multiplanar CT image reconstructions of the cervical spine were also generated. COMPARISON:  None. FINDINGS: CT HEAD FINDINGS The bony calvarium is intact. Mild air-fluid levels are noted within the maxillary antra bilaterally. Diffuse atrophic changes are noted. Scattered areas of decreased attenuation are noted consistent with chronic white matter ischemic change. No findings to suggest acute hemorrhage, acute infarction or space-occupying mass lesion are noted. CT CERVICAL SPINE FINDINGS Seven cervical segments are well visualized. Vertebral  body height is well maintained. Disc space narrowing is noted at C5-6 with endplate sclerosis. Very mild osteophytic changes are seen. No acute fracture or acute facet abnormality is noted. The odontoid is within normal limits. No significant soft tissue abnormality is noted. The  lung apices demonstrate significant density bilaterally. This is worse on the right than the left. This is likely chronic in nature although an acute infiltrative density could not be totally excluded. IMPRESSION: CT of the head: Chronic atrophic and ischemic changes without acute abnormality. Air-fluid levels within the maxillary antra bilaterally. CT of the cervical spine: Degenerative changes without acute bony abnormality. Changes in the lung apices likely of a chronic nature although no prior examination is available for comparison. Electronically Signed   By: Alcide Clever M.D.   On: 12/25/2015 09:12   Dg Hips Bilat With Pelvis 3-4 Views  12/25/2015  CLINICAL DATA:  Seizure, fall EXAM: DG HIP (WITH OR WITHOUT PELVIS) 3-4V BILAT COMPARISON:  None. FINDINGS: Early symmetric degenerative changes in the hips bilaterally. No acute bony abnormality. Specifically, no fracture, subluxation, or dislocation. Soft tissues are intact. Vascular calcifications noted bilaterally. IMPRESSION: Early degenerative changes.  No acute bony abnormality. Electronically Signed   By: Charlett Nose M.D.   On: 12/25/2015 09:36   I have personally reviewed and evaluated these images and lab results as part of my medical decision-making.   EKG Interpretation None      MDM   Final diagnoses:  Seizure [facility])    Patient referred in from nursing facility. Patient with history of dementia Alzheimer's. Patient also with a history of seizures on Keppra. Patient found on the floor next to her bed. When paramedics arrived patient had about a 32nd the generalized type seizure. Patient also had a 10 second seizure here. Patient given some Ativan. Patients  remain stable since. Workup for the potential fall hips and pelvis are negative on x-ray. Head CT CT neck negative. Patient's labs without significant abnormalities. No fever.    Vanetta Mulders, MD 12/25/15 1154

## 2016-01-09 ENCOUNTER — Encounter (HOSPITAL_COMMUNITY): Payer: Self-pay | Admitting: Emergency Medicine

## 2016-01-09 ENCOUNTER — Emergency Department (HOSPITAL_COMMUNITY)
Admission: EM | Admit: 2016-01-09 | Discharge: 2016-01-09 | Disposition: A | Discharge status: Home / Self Care | Payer: Medicare Other | Attending: Emergency Medicine | Admitting: Emergency Medicine

## 2016-01-09 DIAGNOSIS — Y9389 Activity, other specified: Secondary | ICD-10-CM | POA: Insufficient documentation

## 2016-01-09 DIAGNOSIS — Z7982 Long term (current) use of aspirin: Secondary | ICD-10-CM | POA: Diagnosis not present

## 2016-01-09 DIAGNOSIS — Z79899 Other long term (current) drug therapy: Secondary | ICD-10-CM | POA: Diagnosis not present

## 2016-01-09 DIAGNOSIS — I10 Essential (primary) hypertension: Secondary | ICD-10-CM | POA: Diagnosis not present

## 2016-01-09 DIAGNOSIS — Z043 Encounter for examination and observation following other accident: Secondary | ICD-10-CM | POA: Diagnosis not present

## 2016-01-09 DIAGNOSIS — Z8619 Personal history of other infectious and parasitic diseases: Secondary | ICD-10-CM | POA: Insufficient documentation

## 2016-01-09 DIAGNOSIS — G309 Alzheimer's disease, unspecified: Secondary | ICD-10-CM | POA: Diagnosis not present

## 2016-01-09 DIAGNOSIS — Z7951 Long term (current) use of inhaled steroids: Secondary | ICD-10-CM | POA: Diagnosis not present

## 2016-01-09 DIAGNOSIS — Y92129 Unspecified place in nursing home as the place of occurrence of the external cause: Secondary | ICD-10-CM | POA: Diagnosis not present

## 2016-01-09 DIAGNOSIS — J449 Chronic obstructive pulmonary disease, unspecified: Secondary | ICD-10-CM | POA: Insufficient documentation

## 2016-01-09 DIAGNOSIS — Z7902 Long term (current) use of antithrombotics/antiplatelets: Secondary | ICD-10-CM | POA: Diagnosis not present

## 2016-01-09 DIAGNOSIS — Y998 Other external cause status: Secondary | ICD-10-CM | POA: Insufficient documentation

## 2016-01-09 DIAGNOSIS — W19XXXA Unspecified fall, initial encounter: Secondary | ICD-10-CM
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
## 2016-01-09 NOTE — ED Notes (Signed)
Bed: ZO10 Expected date:  Expected time:  Means of arrival:  Comments: EMS- no fall, sent per facility policy

## 2016-01-09 NOTE — ED Notes (Addendum)
Pt given supper tray and eaten 30% of meal. Pt noted ambulating in hallway. Waiting on EMS.

## 2016-01-09 NOTE — Discharge Instructions (Signed)
Fall Prevention in the Home  Falls can cause injuries and can affect people from all age groups. There are many simple things that you can do to make your home safe and to help prevent falls. WHAT CAN I DO ON THE OUTSIDE OF MY HOME?  Regularly repair the edges of walkways and driveways and fix any cracks.  Remove high doorway thresholds.  Trim any shrubbery on the main path into your home.  Use bright outdoor lighting.  Clear walkways of debris and clutter, including tools and rocks.  Regularly check that handrails are securely fastened and in good repair. Both sides of any steps should have handrails.  Install guardrails along the edges of any raised decks or porches.  Have leaves, snow, and ice cleared regularly.  Use sand or salt on walkways during winter months.  In the garage, clean up any spills right away, including grease or oil spills. WHAT CAN I DO IN THE BATHROOM?  Use night lights.  Install grab bars by the toilet and in the tub and shower. Do not use towel bars as grab bars.  Use non-skid mats or decals on the floor of the tub or shower.  If you need to sit down while you are in the shower, use a plastic, non-slip stool..  Keep the floor dry. Immediately clean up any water that spills on the floor.  Remove soap buildup in the tub or shower on a regular basis.  Attach bath mats securely with double-sided non-slip rug tape.  Remove throw rugs and other tripping hazards from the floor. WHAT CAN I DO IN THE BEDROOM?  Use night lights.  Make sure that a bedside light is easy to reach.  Do not use oversized bedding that drapes onto the floor.  Have a firm chair that has side arms to use for getting dressed.  Remove throw rugs and other tripping hazards from the floor. WHAT CAN I DO IN THE KITCHEN?   Clean up any spills right away.  Avoid walking on wet floors.  Place frequently used items in easy-to-reach places.  If you need to reach for something  above you, use a sturdy step stool that has a grab bar.  Keep electrical cables out of the way.  Do not use floor polish or wax that makes floors slippery. If you have to use wax, make sure that it is non-skid floor wax.  Remove throw rugs and other tripping hazards from the floor. WHAT CAN I DO IN THE STAIRWAYS?  Do not leave any items on the stairs.  Make sure that there are handrails on both sides of the stairs. Fix handrails that are broken or loose. Make sure that handrails are as long as the stairways.  Check any carpeting to make sure that it is firmly attached to the stairs. Fix any carpet that is loose or worn.  Avoid having throw rugs at the top or bottom of stairways, or secure the rugs with carpet tape to prevent them from moving.  Make sure that you have a light switch at the top of the stairs and the bottom of the stairs. If you do not have them, have them installed. WHAT ARE SOME OTHER FALL PREVENTION TIPS?  Wear closed-toe shoes that fit well and support your feet. Wear shoes that have rubber soles or low heels.  When you use a stepladder, make sure that it is completely opened and that the sides are firmly locked. Have someone hold the ladder while you   are using it. Do not climb a closed stepladder.  Add color or contrast paint or tape to grab bars and handrails in your home. Place contrasting color strips on the first and last steps.  Use mobility aids as needed, such as canes, walkers, scooters, and crutches.  Turn on lights if it is dark. Replace any light bulbs that burn out.  Set up furniture so that there are clear paths. Keep the furniture in the same spot.  Fix any uneven floor surfaces.  Choose a carpet design that does not hide the edge of steps of a stairway.  Be aware of any and all pets.  Review your medicines with your healthcare provider. Some medicines can cause dizziness or changes in blood pressure, which increase your risk of falling. Talk  with your health care provider about other ways that you can decrease your risk of falls. This may include working with a physical therapist or trainer to improve your strength, balance, and endurance.   This information is not intended to replace advice given to you by your health care provider. Make sure you discuss any questions you have with your health care provider.   Document Released: 11/03/2002 Document Revised: 03/30/2015 Document Reviewed: 12/18/2014 Elsevier Interactive Patient Education 2016 [employer]  

## 2016-01-09 NOTE — ED Notes (Signed)
Pt noted ambulating in hall with steady gait looking for bathroom. Pt assisted to bathroom and back to room. Pt denies pain. ABC's intact. No behavior problems noted.

## 2016-02-02 ENCOUNTER — Emergency Department (HOSPITAL_COMMUNITY)
Admission: EM | Admit: 2016-02-02 | Discharge: 2016-02-02 | Disposition: A | Discharge status: Home / Self Care | Payer: Medicare Other | Attending: Emergency Medicine | Admitting: Emergency Medicine

## 2016-02-02 ENCOUNTER — Encounter (HOSPITAL_COMMUNITY): Payer: Self-pay | Admitting: Emergency Medicine

## 2016-02-02 DIAGNOSIS — Z7982 Long term (current) use of aspirin: Secondary | ICD-10-CM | POA: Diagnosis not present

## 2016-02-02 DIAGNOSIS — R569 Unspecified convulsions: Secondary | ICD-10-CM | POA: Diagnosis present

## 2016-02-02 DIAGNOSIS — Z8619 Personal history of other infectious and parasitic diseases: Secondary | ICD-10-CM | POA: Diagnosis not present

## 2016-02-02 DIAGNOSIS — G309 Alzheimer's disease, unspecified: Secondary | ICD-10-CM | POA: Insufficient documentation

## 2016-02-02 DIAGNOSIS — F0280 Dementia in other diseases classified elsewhere without behavioral disturbance: Secondary | ICD-10-CM | POA: Insufficient documentation

## 2016-02-02 DIAGNOSIS — I10 Essential (primary) hypertension: Secondary | ICD-10-CM | POA: Insufficient documentation

## 2016-02-02 DIAGNOSIS — Z79899 Other long term (current) drug therapy: Secondary | ICD-10-CM | POA: Diagnosis not present

## 2016-02-02 DIAGNOSIS — G40909 Epilepsy, unspecified, not intractable, without status epilepticus: Secondary | ICD-10-CM | POA: Insufficient documentation

## 2016-02-02 DIAGNOSIS — Z7902 Long term (current) use of antithrombotics/antiplatelets: Secondary | ICD-10-CM | POA: Diagnosis not present

## 2016-02-02 DIAGNOSIS — Z7951 Long term (current) use of inhaled steroids: Secondary | ICD-10-CM | POA: Insufficient documentation

## 2016-02-02 DIAGNOSIS — J449 Chronic obstructive pulmonary disease, unspecified: Secondary | ICD-10-CM | POA: Insufficient documentation

## 2016-02-02 LAB — URINE MICROSCOPIC-ADD ON: RBC / HPF: NONE SEEN RBC/hpf (ref 0–5)

## 2016-02-02 LAB — URINALYSIS, ROUTINE W REFLEX MICROSCOPIC
Bilirubin Urine: NEGATIVE
Glucose, UA: NEGATIVE mg/dL
Hgb urine dipstick: NEGATIVE
KETONES UR: NEGATIVE mg/dL
NITRITE: NEGATIVE
PH: 6 (ref 5.0–8.0)
Protein, ur: NEGATIVE mg/dL
Specific Gravity, Urine: 1.019 (ref 1.005–1.030)
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
## 2016-02-02 NOTE — ED Provider Notes (Signed)
CSN: [id number]     Arrival date & time 02/02/16  1716 History   First MD Initiated Contact with Patient 02/02/16 1850     Chief Complaint  Patient presents with  . Seizures     (Consider location/radiation/quality/duration/timing/severity/associated sxs/prior Treatment) HPI  Tanya Rivas is a 65 y.o. femaleWho presents for evaluation of reported seizure. Report from EMS is that she had 3 petit mal seizures.  Patient is unable to give history. Patient denies any problems at this time.  Level V caveat- dementia    Past Medical History  Diagnosis Date  . Alzheimer disease   . Seizures (HCC)   . Hypertension   . COPD (chronic obstructive pulmonary disease) (HCC)   . Hepatitis C carrier    Past Surgical History  Procedure Laterality Date  . Abdominal hysterectomy     Family History  Problem Relation Age of Onset  . Family history unknown: Yes   Social History  Substance Use Topics  . Smoking status: Never Smoker   . Smokeless tobacco: None  . Alcohol Use: No   OB History    No data available     Review of Systems  Unable to perform ROS: Dementia      Allergies  Review of patient's allergies indicates no known allergies.  Home Medications   Prior to Admission medications   Medication Sig Start Date End Date Taking? Authorizing Provider  acetaminophen (TYLENOL) 500 MG tablet Take 500 mg by mouth 2 (two) times daily.    Yes Historical Provider, MD  acetaminophen (TYLENOL) 500 MG tablet Take 500 mg by mouth every 4 (four) hours as needed for mild pain, fever or headache.   Yes Historical Provider, MD  alum & mag hydroxide-simeth (MINTOX) 200-200-20 MG/5ML suspension Take 30 mLs by mouth as needed for indigestion or heartburn.   Yes Historical Provider, MD  amLODipine (NORVASC) 5 MG tablet Take 5 mg by mouth daily.   Yes Historical Provider, MD  aspirin 81 MG chewable tablet Chew 81 mg by mouth daily.   Yes Historical Provider, MD  benazepril (LOTENSIN) 10 MG  tablet Take 10 mg by mouth daily.   Yes Historical Provider, MD  cholecalciferol (VITAMIN D) 1000 UNITS tablet Take 1,000 Units by mouth daily.   Yes Historical Provider, MD  clopidogrel (PLAVIX) 75 MG tablet Take 75 mg by mouth daily.   Yes Historical Provider, MD  donepezil (ARICEPT) 10 MG tablet Take 10 mg by mouth at bedtime.   Yes Historical Provider, MD  Fluticasone-Salmeterol (ADVAIR) 250-50 MCG/DOSE AEPB Inhale 1 puff into the lungs 2 (two) times daily.   Yes Historical Provider, MD  furosemide (LASIX) 20 MG tablet Take 20 mg by mouth daily.   Yes Historical Provider, MD  guaifenesin (ROBAFEN) 100 MG/5ML syrup Take 200 mg by mouth every 6 (six) hours as needed for cough.   Yes Historical Provider, MD  isosorbide mononitrate (IMDUR) 60 MG 24 hr tablet Take 60 mg by mouth daily.   Yes Historical Provider, MD  levETIRAcetam (KEPPRA) 750 MG tablet One in am and 2 tabs po qhs Patient taking differently: Take 750-1,500 mg by mouth 2 (two) times daily. One in am and 2 tabs po qhs 06/08/15  Yes Levert FeinsteinYijun Yan, MD  loperamide (IMODIUM) 2 MG capsule Take 2 mg by mouth as needed for diarrhea or loose stools.   Yes Historical Provider, MD  LORazepam (ATIVAN) 0.5 MG tablet Take 0.5 mg by mouth 2 (two) times daily.   Yes Historical  Provider, MD  LORazepam (ATIVAN) 0.5 MG tablet Take 0.5 mg by mouth every 8 (eight) hours as needed for anxiety.   Yes Historical Provider, MD  magnesium hydroxide (MILK OF MAGNESIA) 400 MG/5ML suspension Take 30 mLs by mouth at bedtime as needed for mild constipation.   Yes Historical Provider, MD  Melatonin 3 MG TABS Take 1 tablet by mouth at bedtime.   Yes Historical Provider, MD  memantine (NAMENDA) 10 MG tablet Take 1 tablet (10 mg total) by mouth 2 (two) times daily. 06/08/15  Yes Levert Feinstein, MD  metoprolol (LOPRESSOR) 50 MG tablet Take 50 mg by mouth 2 (two) times daily.   Yes Historical Provider, MD  mirtazapine (REMERON) 15 MG tablet Take 15 mg by mouth at bedtime.   Yes  Historical Provider, MD  Multiple Vitamin (DAILY VITE) TABS Take 1 tablet by mouth daily.   Yes Historical Provider, MD  neomycin-bacitracin-polymyxin (NEOSPORIN) [phone] ointment Apply 1 application topically daily as needed (skin tears or abrasions.).    Yes Historical Provider, MD  QUEtiapine (SEROQUEL) 50 MG tablet Take 50 mg by mouth 2 (two) times daily.   Yes Historical Provider, MD  ranolazine (RANEXA) 1000 MG SR tablet Take 1,000 mg by mouth 2 (two) times daily.   Yes Historical Provider, MD  thiamine (VITAMIN B-1) 100 MG tablet Take 100 mg by mouth daily.   Yes Historical Provider, MD  tiotropium (SPIRIVA) 18 MCG inhalation capsule Place 18 mcg into inhaler and inhale daily.   Yes Historical Provider, MD  traZODone (DESYREL) 50 MG tablet Take 50 mg by mouth at bedtime.   Yes Historical Provider, MD  VENTOLIN HFA 108 (90 BASE) MCG/ACT inhaler Inhale 1 puff into the lungs every 4 (four) hours as needed for shortness of breath.  03/29/15  Yes Historical Provider, MD   BP 132/78 mmHg  Pulse 63  Resp 15  SpO2 92% Physical Exam  Constitutional: She is oriented to person, place, and time. She appears well-developed and well-nourished.  HENT:  Head: Normocephalic and atraumatic.  Right Ear: External ear normal.  Left Ear: External ear normal.  Eyes: Conjunctivae and EOM are normal. Pupils are equal, round, and reactive to light.  Neck: Normal range of motion and phonation normal. Neck supple.  Cardiovascular: Normal rate, regular rhythm and normal heart sounds.   Pulmonary/Chest: Effort normal and breath sounds normal. She exhibits no bony tenderness.  Abdominal: Soft. There is no tenderness.  Musculoskeletal: Normal range of motion.  Neurological: She is alert and oriented to person, place, and time. No cranial nerve deficit or sensory deficit. She exhibits normal muscle tone. Coordination normal.  No dysarthria, aphasia or nystagmus. She is a poor historian.  Skin: Skin is warm, dry  and intact.  Psychiatric: She has a normal mood and affect. Her behavior is normal.  Nursing note and vitals reviewed.   ED Course  Procedures (including critical care time) Medications - No data to display  Patient Vitals for the past 24 hrs:  BP Pulse Resp SpO2  02/02/16 2039 132/78 mmHg 63 15 92 %  02/02/16 1729 106/60 mmHg 70 20 93 %   20:00- patient refused blood draw  8:47 PM Reevaluation with update and discussion. After initial assessment and treatment, an updated evaluation reveals of further complaints. No seizure, in ED. Attempt made to contact her facility, but no one answered the phone. Shalise Rosado L    Labs Review Labs Reviewed  URINALYSIS, ROUTINE W REFLEX MICROSCOPIC (NOT AT [facility]) - Abnormal; Notable  for the following:    Color, Urine AMBER (*)    Leukocytes, UA TRACE (*)    All other components within normal limits  URINE MICROSCOPIC-ADD ON - Abnormal; Notable for the following:    Squamous Epithelial / LPF 0-5 (*)    Bacteria, UA RARE (*)    All other components within normal limits  URINE CULTURE  BASIC METABOLIC PANEL  CBC WITH DIFFERENTIAL/PLATELET    Imaging Review No results found. I have personally reviewed and evaluated these images and lab results as part of my medical decision-making.   EKG Interpretation None      MDM   Final diagnoses:  Seizure (HCC)    Reported seizure in patient with known epilepsy.  Neurologic status appears at baseline. Urinalysis does not indicate infection. Patient refused blood draw.I think that is unlikely that she has an unstable, or inapproperly treated seizure disorder.She is therefore stable for discharge.   Nursing Notes Reviewed/ Care Coordinated Applicable Imaging Reviewed Interpretation of Laboratory Data incorporated into ED treatment  The patient appears reasonably screened and/or stabilized for discharge and I doubt any other medical condition or other [facility] requiring further screening,  evaluation, or treatment in the ED at this time prior to discharge.  Plan: Home Medications- usual; Home Treatments- rest; return here if the recommended treatment, does not improve the symptoms; Recommended follow up- PCP 1 week     Mancel Bale, MD 02/02/16 2051

## 2016-02-02 NOTE — ED Notes (Signed)
Blood draw delayed, pt currently fully dressed and saying she doesn't need blood work done.  Pt currently  in restroom, hat was put in toilet to collect urine sample.

## 2016-02-02 NOTE — Discharge Instructions (Signed)

## 2016-02-04 LAB — URINE CULTURE: Special Requests: NORMAL

## 2016-05-13 ENCOUNTER — Encounter (HOSPITAL_COMMUNITY): Payer: Self-pay | Admitting: *Deleted

## 2016-05-13 ENCOUNTER — Emergency Department (HOSPITAL_COMMUNITY)
Admission: EM | Admit: 2016-05-13 | Discharge: 2016-05-13 | Disposition: A | Discharge status: Home / Self Care | Payer: Medicare Other | Attending: Emergency Medicine | Admitting: Emergency Medicine

## 2016-05-13 DIAGNOSIS — Y939 Activity, unspecified: Secondary | ICD-10-CM | POA: Diagnosis not present

## 2016-05-13 DIAGNOSIS — G309 Alzheimer's disease, unspecified: Secondary | ICD-10-CM | POA: Insufficient documentation

## 2016-05-13 DIAGNOSIS — W19XXXA Unspecified fall, initial encounter: Secondary | ICD-10-CM | POA: Insufficient documentation

## 2016-05-13 DIAGNOSIS — Z7982 Long term (current) use of aspirin: Secondary | ICD-10-CM | POA: Diagnosis not present

## 2016-05-13 DIAGNOSIS — Y92129 Unspecified place in nursing home as the place of occurrence of the external cause: Secondary | ICD-10-CM | POA: Insufficient documentation

## 2016-05-13 DIAGNOSIS — I10 Essential (primary) hypertension: Secondary | ICD-10-CM | POA: Insufficient documentation

## 2016-05-13 DIAGNOSIS — R252 Cramp and spasm: Secondary | ICD-10-CM | POA: Insufficient documentation

## 2016-05-13 DIAGNOSIS — Y999 Unspecified external cause status: Secondary | ICD-10-CM | POA: Insufficient documentation

## 2016-05-13 DIAGNOSIS — Z79899 Other long term (current) drug therapy: Secondary | ICD-10-CM | POA: Diagnosis not present

## 2016-05-13 DIAGNOSIS — J449 Chronic obstructive pulmonary disease, unspecified: Secondary | ICD-10-CM | POA: Diagnosis not present

## 2016-05-13 NOTE — ED Notes (Signed)
[facility], pt had unwitnessed fall, denies injury, staff reports some type of jerking motion.

## 2016-05-13 NOTE — ED Notes (Signed)
[facility] arrived to transport patient to nursing facility.
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
## 2016-05-13 NOTE — ED Provider Notes (Addendum)
CSN: [id number]     Arrival date & time 05/13/16  1212 History   First MD Initiated Contact with Patient 05/13/16 1301     Chief Complaint  Patient presents with  . Fall     (Consider location/radiation/quality/duration/timing/severity/associated sxs/prior Treatment) HPI Comments: Patient is a 65 year old female with a history of hypertension, seizure disorder on Keppra and Alzheimer's. She was brought in by EMS today for evaluation. At her nursing facility they heard her fall. When they came to check on her she was getting up but had some mild twitching in her arm. They deny any postictal events, nausea, vomiting or loss of consciousness. Before transport here they confirm the patient was at her baseline mental status. Here patient has no complaints. She denies any pain, headache. She denies having a seizure.  Patient is a 65 y.o. female presenting with fall. The history is provided by the patient. The history is limited by the absence of a caregiver.  Fall This is a new problem. The current episode started less than 1 hour ago. The problem occurs constantly. The problem has not changed since onset.Associated symptoms comments: No complaints. Nothing aggravates the symptoms. Nothing relieves the symptoms. She has tried nothing for the symptoms. The treatment provided no relief.    Past Medical History  Diagnosis Date  . Alzheimer disease   . Seizures (HCC)   . Hypertension   . COPD (chronic obstructive pulmonary disease) (HCC)   . Hepatitis C carrier    Past Surgical History  Procedure Laterality Date  . Abdominal hysterectomy     Family History  Problem Relation Age of Onset  . Family history unknown: Yes   Social History  Substance Use Topics  . Smoking status: Never Smoker   . Smokeless tobacco: None  . Alcohol Use: No   OB History    No data available     Review of Systems  All other systems reviewed and are negative.     Allergies  Review of patient's allergies  indicates no known allergies.  Home Medications   Prior to Admission medications   Medication Sig Start Date End Date Taking? Authorizing Provider  acetaminophen (TYLENOL) 500 MG tablet Take 500 mg by mouth 2 (two) times daily.     Historical Provider, MD  acetaminophen (TYLENOL) 500 MG tablet Take 500 mg by mouth every 4 (four) hours as needed for mild pain, fever or headache.    Historical Provider, MD  alum & mag hydroxide-simeth (MINTOX) 200-200-20 MG/5ML suspension Take 30 mLs by mouth as needed for indigestion or heartburn.    Historical Provider, MD  amLODipine (NORVASC) 5 MG tablet Take 5 mg by mouth daily.    Historical Provider, MD  aspirin 81 MG chewable tablet Chew 81 mg by mouth daily.    Historical Provider, MD  benazepril (LOTENSIN) 10 MG tablet Take 10 mg by mouth daily.    Historical Provider, MD  cholecalciferol (VITAMIN D) 1000 UNITS tablet Take 1,000 Units by mouth daily.    Historical Provider, MD  clopidogrel (PLAVIX) 75 MG tablet Take 75 mg by mouth daily.    Historical Provider, MD  donepezil (ARICEPT) 10 MG tablet Take 10 mg by mouth at bedtime.    Historical Provider, MD  Fluticasone-Salmeterol (ADVAIR) 250-50 MCG/DOSE AEPB Inhale 1 puff into the lungs 2 (two) times daily.    Historical Provider, MD  furosemide (LASIX) 20 MG tablet Take 20 mg by mouth daily.    Historical Provider, MD  guaifenesin (ROBAFEN)  100 MG/5ML syrup Take 200 mg by mouth every 6 (six) hours as needed for cough.    Historical Provider, MD  isosorbide mononitrate (IMDUR) 60 MG 24 hr tablet Take 60 mg by mouth daily.    Historical Provider, MD  levETIRAcetam (KEPPRA) 750 MG tablet One in am and 2 tabs po qhs Patient taking differently: Take 750-1,500 mg by mouth 2 (two) times daily. One in am and 2 tabs po qhs 06/08/15   Levert Feinstein, MD  loperamide (IMODIUM) 2 MG capsule Take 2 mg by mouth as needed for diarrhea or loose stools.    Historical Provider, MD  LORazepam (ATIVAN) 0.5 MG tablet Take 0.5 mg  by mouth 2 (two) times daily.    Historical Provider, MD  LORazepam (ATIVAN) 0.5 MG tablet Take 0.5 mg by mouth every 8 (eight) hours as needed for anxiety.    Historical Provider, MD  magnesium hydroxide (MILK OF MAGNESIA) 400 MG/5ML suspension Take 30 mLs by mouth at bedtime as needed for mild constipation.    Historical Provider, MD  Melatonin 3 MG TABS Take 1 tablet by mouth at bedtime.    Historical Provider, MD  memantine (NAMENDA) 10 MG tablet Take 1 tablet (10 mg total) by mouth 2 (two) times daily. 06/08/15   Levert Feinstein, MD  metoprolol (LOPRESSOR) 50 MG tablet Take 50 mg by mouth 2 (two) times daily.    Historical Provider, MD  mirtazapine (REMERON) 15 MG tablet Take 15 mg by mouth at bedtime.    Historical Provider, MD  Multiple Vitamin (DAILY VITE) TABS Take 1 tablet by mouth daily.    Historical Provider, MD  neomycin-bacitracin-polymyxin (NEOSPORIN) [phone] ointment Apply 1 application topically daily as needed (skin tears or abrasions.).     Historical Provider, MD  QUEtiapine (SEROQUEL) 50 MG tablet Take 50 mg by mouth 2 (two) times daily.    Historical Provider, MD  ranolazine (RANEXA) 1000 MG SR tablet Take 1,000 mg by mouth 2 (two) times daily.    Historical Provider, MD  thiamine (VITAMIN B-1) 100 MG tablet Take 100 mg by mouth daily.    Historical Provider, MD  tiotropium (SPIRIVA) 18 MCG inhalation capsule Place 18 mcg into inhaler and inhale daily.    Historical Provider, MD  traZODone (DESYREL) 50 MG tablet Take 50 mg by mouth at bedtime.    Historical Provider, MD  VENTOLIN HFA 108 (90 BASE) MCG/ACT inhaler Inhale 1 puff into the lungs every 4 (four) hours as needed for shortness of breath.  03/29/15   Historical Provider, MD   BP 107/96 mmHg  Pulse 54  Temp(Src) 97.4 F (36.3 C) (Oral)  Resp 15  SpO2 97% Physical Exam  Constitutional: She appears well-developed and well-nourished. No distress.  HENT:  Head: Normocephalic and atraumatic.  Mouth/Throat: Oropharynx is  clear and moist.  Eyes: Conjunctivae and EOM are normal. Pupils are equal, round, and reactive to light.  Neck: Normal range of motion. Neck supple.  Cardiovascular: Normal rate, regular rhythm and intact distal pulses.   No murmur heard. Pulmonary/Chest: Effort normal and breath sounds normal. No respiratory distress. She has no wheezes. She has no rales.  Abdominal: Soft. She exhibits no distension. There is no tenderness. There is no rebound and no guarding.  Musculoskeletal: Normal range of motion. She exhibits no edema or tenderness.       Right hip: Normal.       Left hip: Normal.       Cervical back: Normal.  Neurological: She  is alert. She has normal strength. No sensory deficit.  Oriented to self.  Occasionally answers questions accurately  Skin: Skin is warm and dry. No rash noted. No erythema.  Psychiatric: She has a normal mood and affect. Her behavior is normal.  Nursing note and vitals reviewed.   ED Course  Procedures (including critical care time) Labs Review Labs Reviewed - No data to display  Imaging Review No results found. I have personally reviewed and evaluated these images and lab results as part of my medical decision-making.   EKG Interpretation None      MDM   Final diagnoses:  Fall, initial encounter   Patient is a 65 y/o female being brought in today after a fall at her nursing facility. There was question whether patient potentially had a seizure because they noticed some jerking in her arm when she was attempting to get out of the floor. However she was never witnessed to have any loss of consciousness or generalized shaking. She had no post ictal.  Facility denied patient being different lately. She has been acting her normal self eating and drinking normally. Patient currently denies any symptoms. She is able to move all extremities without pain. She has no C-spine tenderness. Facility confirmed she was at her baseline prior to arrival. Vital signs  are within normal limits.  Patient does take Plavix however there was no report of head injury and patient has no evidence of injury to the head here.  At this time feel patient is safe to be discharged home. She is to continue all of her current medications at this time.     Gwyneth Sprout, MD 05/13/16 1317  Gwyneth Sprout, MD 05/13/16 1319

## 2016-05-17 ENCOUNTER — Emergency Department (HOSPITAL_COMMUNITY): Payer: Medicare Other

## 2016-05-17 ENCOUNTER — Emergency Department (HOSPITAL_COMMUNITY)
Admission: EM | Admit: 2016-05-17 | Discharge: 2016-05-17 | Disposition: A | Discharge status: Home / Self Care | Payer: Medicare Other | Attending: Emergency Medicine | Admitting: Emergency Medicine

## 2016-05-17 ENCOUNTER — Other Ambulatory Visit: Payer: Self-pay

## 2016-05-17 ENCOUNTER — Encounter (HOSPITAL_COMMUNITY): Payer: Self-pay | Admitting: Emergency Medicine

## 2016-05-17 DIAGNOSIS — Y929 Unspecified place or not applicable: Secondary | ICD-10-CM | POA: Diagnosis not present

## 2016-05-17 DIAGNOSIS — Z7982 Long term (current) use of aspirin: Secondary | ICD-10-CM | POA: Diagnosis not present

## 2016-05-17 DIAGNOSIS — Z7901 Long term (current) use of anticoagulants: Secondary | ICD-10-CM | POA: Diagnosis not present

## 2016-05-17 DIAGNOSIS — W010XXA Fall on same level from slipping, tripping and stumbling without subsequent striking against object, initial encounter: Secondary | ICD-10-CM | POA: Insufficient documentation

## 2016-05-17 DIAGNOSIS — Z7902 Long term (current) use of antithrombotics/antiplatelets: Secondary | ICD-10-CM | POA: Insufficient documentation

## 2016-05-17 DIAGNOSIS — I10 Essential (primary) hypertension: Secondary | ICD-10-CM | POA: Insufficient documentation

## 2016-05-17 DIAGNOSIS — G309 Alzheimer's disease, unspecified: Secondary | ICD-10-CM | POA: Diagnosis not present

## 2016-05-17 DIAGNOSIS — W19XXXA Unspecified fall, initial encounter: Secondary | ICD-10-CM

## 2016-05-17 DIAGNOSIS — Y9389 Activity, other specified: Secondary | ICD-10-CM | POA: Diagnosis not present

## 2016-05-17 DIAGNOSIS — J449 Chronic obstructive pulmonary disease, unspecified: Secondary | ICD-10-CM | POA: Insufficient documentation

## 2016-05-17 DIAGNOSIS — Y999 Unspecified external cause status: Secondary | ICD-10-CM | POA: Insufficient documentation

## 2016-05-17 DIAGNOSIS — F0280 Dementia in other diseases classified elsewhere without behavioral disturbance: Secondary | ICD-10-CM | POA: Diagnosis present

## 2016-05-17 LAB — URINALYSIS, ROUTINE W REFLEX MICROSCOPIC
BILIRUBIN URINE: NEGATIVE
GLUCOSE, UA: NEGATIVE mg/dL
Hgb urine dipstick: NEGATIVE
KETONES UR: NEGATIVE mg/dL
LEUKOCYTES UA: NEGATIVE
NITRITE: NEGATIVE
PH: 5.5 (ref 5.0–8.0)
PROTEIN: NEGATIVE mg/dL
Specific Gravity, Urine: 1.011 (ref 1.005–1.030)

## 2016-05-17 LAB — CBC WITH DIFFERENTIAL/PLATELET
Basophils Absolute: 0 10*3/uL (ref 0.0–0.1)
Basophils Relative: 0 %
EOS PCT: 1 %
Eosinophils Absolute: 0.1 10*3/uL (ref 0.0–0.7)
HEMATOCRIT: 36.6 % (ref 36.0–46.0)
Hemoglobin: 11.7 g/dL — ABNORMAL LOW (ref 12.0–15.0)
LYMPHS ABS: 3.2 10*3/uL (ref 0.7–4.0)
LYMPHS PCT: 57 %
MCH: 26.5 pg (ref 26.0–34.0)
MCHC: 32 g/dL (ref 30.0–36.0)
MCV: 82.8 fL (ref 78.0–100.0)
MONO ABS: 0.4 10*3/uL (ref 0.1–1.0)
MONOS PCT: 7 %
Neutro Abs: 1.9 10*3/uL (ref 1.7–7.7)
Neutrophils Relative %: 35 %
PLATELETS: 148 10*3/uL — AB (ref 150–400)
RBC: 4.42 MIL/uL (ref 3.87–5.11)
RDW: 14.9 % (ref 11.5–15.5)
WBC: 5.6 10*3/uL (ref 4.0–10.5)

## 2016-05-17 LAB — COMPREHENSIVE METABOLIC PANEL
ALT: 19 U/L (ref 14–54)
AST: 23 U/L (ref 15–41)
Albumin: 2.9 g/dL — ABNORMAL LOW (ref 3.5–5.0)
Alkaline Phosphatase: 60 U/L (ref 38–126)
Anion gap: 8 (ref 5–15)
BILIRUBIN TOTAL: 0.5 mg/dL (ref 0.3–1.2)
BUN: 16 mg/dL (ref 6–20)
CHLORIDE: 105 mmol/L (ref 101–111)
CO2: 22 mmol/L (ref 22–32)
Calcium: 9 mg/dL (ref 8.9–10.3)
Creatinine, Ser: 1.2 mg/dL — ABNORMAL HIGH (ref 0.44–1.00)
GFR calc non Af Amer: 46 mL/min — ABNORMAL LOW (ref >60)
GFR, EST AFRICAN AMERICAN: 53 mL/min — AB (ref >60)
Glucose, Bld: 83 mg/dL (ref 65–99)
POTASSIUM: 4.5 mmol/L (ref 3.5–5.1)
Sodium: 135 mmol/L (ref 135–145)
TOTAL PROTEIN: 7.8 g/dL (ref 6.5–8.1)

## 2016-05-17 LAB — I-STAT TROPONIN, ED: TROPONIN I, POC: 0 ng/mL (ref 0.00–0.08)

## 2016-05-17 NOTE — Discharge Instructions (Signed)
Follow up with your md if any problems °

## 2016-05-17 NOTE — ED Provider Notes (Signed)
CSN: [id number]     Arrival date & time 05/17/16  1147 History   First MD Initiated Contact with Patient 05/17/16 1205     Chief Complaint  Patient presents with  . Fall     (Consider location/radiation/quality/duration/timing/severity/associated sxs/prior Treatment) Patient is a 65 y.o. female presenting with fall. The history is provided by the EMS personnel (Patient is at rehabilitation and fell. Patient has dementia. The fall was unwitnessed).  Fall This is a new problem. The current episode started 3 to 5 hours ago. The problem occurs rarely. The problem has been resolved. Pertinent negatives include no chest pain and no abdominal pain. Nothing aggravates the symptoms. Nothing relieves the symptoms. She has tried nothing for the symptoms.    Past Medical History  Diagnosis Date  . Alzheimer disease   . Seizures (HCC)   . Hypertension   . COPD (chronic obstructive pulmonary disease) (HCC)   . Hepatitis C carrier    Past Surgical History  Procedure Laterality Date  . Abdominal hysterectomy     Family History  Problem Relation Age of Onset  . Family history unknown: Yes   Social History  Substance Use Topics  . Smoking status: Never Smoker   . Smokeless tobacco: None  . Alcohol Use: No   OB History    No data available     Review of Systems  Unable to perform ROS: Dementia  Cardiovascular: Negative for chest pain.  Gastrointestinal: Negative for abdominal pain.      Allergies  Review of patient's allergies indicates no known allergies.  Home Medications   Prior to Admission medications   Medication Sig Start Date End Date Taking? Authorizing Provider  acetaminophen (TYLENOL) 500 MG tablet Take 500 mg by mouth 2 (two) times daily.    Yes Historical Provider, MD  acetaminophen (TYLENOL) 500 MG tablet Take 500 mg by mouth every 4 (four) hours as needed for mild pain, fever or headache.   Yes Historical Provider, MD  alum & mag hydroxide-simeth (MINTOX)  200-200-20 MG/5ML suspension Take 30 mLs by mouth as needed for indigestion or heartburn.   Yes Historical Provider, MD  amLODipine (NORVASC) 10 MG tablet Take 10 mg by mouth daily.   Yes Historical Provider, MD  aspirin 81 MG chewable tablet Chew 81 mg by mouth daily.   Yes Historical Provider, MD  benazepril (LOTENSIN) 10 MG tablet Take 10 mg by mouth daily.   Yes Historical Provider, MD  cholecalciferol (VITAMIN D) 1000 UNITS tablet Take 1,000 Units by mouth daily.   Yes Historical Provider, MD  clopidogrel (PLAVIX) 75 MG tablet Take 75 mg by mouth daily.   Yes Historical Provider, MD  donepezil (ARICEPT) 10 MG tablet Take 10 mg by mouth at bedtime.   Yes Historical Provider, MD  Fluticasone-Salmeterol (ADVAIR) 250-50 MCG/DOSE AEPB Inhale 1 puff into the lungs 2 (two) times daily.   Yes Historical Provider, MD  furosemide (LASIX) 20 MG tablet Take 20 mg by mouth daily.   Yes Historical Provider, MD  guaifenesin (ROBAFEN) 100 MG/5ML syrup Take 200 mg by mouth every 6 (six) hours as needed for cough.   Yes Historical Provider, MD  isosorbide mononitrate (IMDUR) 60 MG 24 hr tablet Take 60 mg by mouth daily.   Yes Historical Provider, MD  levETIRAcetam (KEPPRA) 750 MG tablet One in am and 2 tabs po qhs Patient taking differently: Take 750-1,500 mg by mouth 2 (two) times daily. One in am and 2 tabs po qhs 06/08/15  Yes  Levert Feinstein, MD  loperamide (IMODIUM) 2 MG capsule Take 2 mg by mouth as needed for diarrhea or loose stools.   Yes Historical Provider, MD  LORazepam (ATIVAN) 0.5 MG tablet Take 0.5 mg by mouth 2 (two) times daily.   Yes Historical Provider, MD  LORazepam (ATIVAN) 0.5 MG tablet Take 0.5 mg by mouth every 8 (eight) hours as needed for anxiety.   Yes Historical Provider, MD  magnesium hydroxide (MILK OF MAGNESIA) 400 MG/5ML suspension Take 30 mLs by mouth at bedtime as needed for mild constipation.   Yes Historical Provider, MD  Melatonin 3 MG TABS Take 1 tablet by mouth at bedtime.   Yes  Historical Provider, MD  memantine (NAMENDA) 10 MG tablet Take 1 tablet (10 mg total) by mouth 2 (two) times daily. 06/08/15  Yes Levert Feinstein, MD  metoprolol (LOPRESSOR) 50 MG tablet Take 50 mg by mouth 2 (two) times daily.   Yes Historical Provider, MD  mirtazapine (REMERON) 15 MG tablet Take 7.5 mg by mouth at bedtime.    Yes Historical Provider, MD  Multiple Vitamin (DAILY VITE) TABS Take 1 tablet by mouth daily.   Yes Historical Provider, MD  neomycin-bacitracin-polymyxin (NEOSPORIN) [phone] ointment Apply 1 application topically daily as needed (skin tears or abrasions.).    Yes Historical Provider, MD  PARoxetine (PAXIL) 10 MG tablet Take 10 mg by mouth at bedtime.   Yes Historical Provider, MD  QUEtiapine (SEROQUEL) 50 MG tablet Take 50 mg by mouth 2 (two) times daily.   Yes Historical Provider, MD  ranolazine (RANEXA) 1000 MG SR tablet Take 1,000 mg by mouth 2 (two) times daily.   Yes Historical Provider, MD  thiamine (VITAMIN B-1) 100 MG tablet Take 100 mg by mouth daily.   Yes Historical Provider, MD  tiotropium (SPIRIVA) 18 MCG inhalation capsule Place 18 mcg into inhaler and inhale daily.   Yes Historical Provider, MD  traZODone (DESYREL) 50 MG tablet Take 50 mg by mouth at bedtime.   Yes Historical Provider, MD  VENTOLIN HFA 108 (90 BASE) MCG/ACT inhaler Inhale 1 puff into the lungs every 4 (four) hours as needed for shortness of breath.  03/29/15  Yes Historical Provider, MD   BP 125/78 mmHg  Pulse 53  Resp 14  SpO2 97% Physical Exam  Constitutional: She appears well-developed.  HENT:  Head: Normocephalic.  Patient has a c-collar on her neck is nontender  Eyes: Conjunctivae and EOM are normal. No scleral icterus.  Neck: Neck supple. No thyromegaly present.  Cardiovascular: Normal rate and regular rhythm.  Exam reveals no gallop and no friction rub.   No murmur heard. Pulmonary/Chest: No stridor. She has no wheezes. She has no rales. She exhibits no tenderness.  Abdominal: She  exhibits no distension. There is no tenderness. There is no rebound.  Musculoskeletal: Normal range of motion. She exhibits no edema.  Lymphadenopathy:    She has no cervical adenopathy.  Neurological: She is alert. She exhibits normal muscle tone. Coordination normal.  Oriented to person only  Skin: No rash noted. No erythema.  Psychiatric: She has a normal mood and affect. Her behavior is normal.    ED Course  Procedures (including critical care time) Labs Review Labs Reviewed  CBC WITH DIFFERENTIAL/PLATELET - Abnormal; Notable for the following:    Hemoglobin 11.7 (*)    Platelets 148 (*)    All other components within normal limits  COMPREHENSIVE METABOLIC PANEL - Abnormal; Notable for the following:    Creatinine, Ser 1.20 (*)  Albumin 2.9 (*)    GFR calc non Af Amer 46 (*)    GFR calc Af Amer 53 (*)    All other components within normal limits  URINALYSIS, ROUTINE W REFLEX MICROSCOPIC (NOT AT [facility]) - Abnormal; Notable for the following:    APPearance CLOUDY (*)    All other components within normal limits  Rose[facility]a SensorI-STAT TROPOININ, ED    Imaging Review Dg Chest 2 View  05/17/2016  CLINICAL DATA:  Fall.  Pain. EXAM: CHEST  2 VIEW COMPARISON:  None. FINDINGS: Heart is normal size. No confluent airspace opacities or effusions. No acute bony abnormality. Degenerative changes in the shoulders. IMPRESSION: No active cardiopulmonary disease. Electronically Signed   By: Charlett NoseKevin  Dover M.D.   On: 05/17/2016 13:28   Ct Head Wo Contrast  05/17/2016  CLINICAL DATA:  65 year old female with history of trauma after for falling. Witnessed seizure like activity. Altered mental status (history of Alzheimer's disease). History of seizures. EXAM: CT HEAD WITHOUT CONTRAST CT CERVICAL SPINE WITHOUT CONTRAST TECHNIQUE: Multidetector CT imaging of the head and cervical spine was performed following the standard protocol without intravenous contrast. Multiplanar CT image reconstructions of the cervical  spine were also generated. COMPARISON:  Head CT and cervical spine CT 12/25/2015. FINDINGS: CT HEAD FINDINGS Moderate cerebral atrophy with ex vacuo dilatation of the ventricular system, similar to the prior study. Patchy and confluent areas of decreased attenuation are noted throughout the deep and periventricular white matter of the cerebral hemispheres bilaterally, compatible with chronic microvascular ischemic disease. No acute intracranial abnormalities. Specifically, no evidence of acute intracranial hemorrhage, no definite findings of acute/subacute cerebral ischemia, no mass, mass effect, hydrocephalus or abnormal intra or extra-axial fluid collections. Visualized paranasal sinuses and mastoids are well pneumatized. No acute displaced skull fractures are identified. 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 This is very similar to prior CT scan 12/25/2015. CT CERVICAL SPINE FINDINGS No acute displaced fractures of the cervical spine. Alignment is anatomic. Prevertebral soft tissues are normal. Small hemangioma in the anterior aspect of C6 vertebral body on the left side again incidentally noted. Mild multilevel degenerative disc disease, most apparent at C5-C6. Mild multilevel facet arthropathy. Visualized portions of the thorax again demonstrate extensive scarring in the apices of the lungs bilaterally, similar to the prior examination, presumably post infectious/inflammatory. IMPRESSION: 1. No acute intracranial abnormalities. 2. No evidence of significant acute traumatic injury to the cervical spine. 3. Moderate cerebral atrophy with ex vacuo dilatation of the ventricular system and extensive chronic microvascular ischemic changes in the cerebral white matter redemonstrated, as above. 4.  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 5. Multilevel degenerative disc disease and cervical spondylosis. Electronically Signed   By: Trudie Reedaniel  Entrikin M.D.   On: 05/17/2016 12:46   Ct Cervical Spine Wo Contrast  05/17/2016  CLINICAL DATA:  65 year old female with history of trauma after for falling. Witnessed seizure like activity. Altered mental status (history of Alzheimer's disease). History of seizures. EXAM: CT HEAD WITHOUT CONTRAST CT CERVICAL SPINE WITHOUT CONTRAST TECHNIQUE: Multidetector CT imaging of the head and cervical spine was performed following the standard protocol without intravenous contrast. Multiplanar CT image reconstructions of the cervical spine were also generated. COMPARISON:  Head CT and cervical spine CT 12/25/2015. FINDINGS: CT HEAD FINDINGS Moderate cerebral atrophy  with ex vacuo dilatation of the ventricular system, similar to the prior study. Patchy and confluent areas of decreased attenuation are noted throughout the deep and periventricular white matter of the cerebral hemispheres bilaterally, compatible with chronic microvascular ischemic disease. No acute intracranial abnormalities. Specifically, no evidence of acute intracranial hemorrhage, no definite findings of acute/subacute cerebral ischemia, no mass, mass effect, hydrocephalus or abnormal intra or extra-axial fluid collections. Visualized paranasal sinuses and mastoids are well pneumatized. No acute displaced skull fractures are identified. 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 This is very similar to prior CT scan 12/25/2015. CT CERVICAL SPINE FINDINGS No acute displaced fractures of the cervical spine. Alignment is anatomic. Prevertebral soft tissues are normal. Small  hemangioma in the anterior aspect of C6 vertebral body on the left side again incidentally noted. Mild multilevel degenerative disc disease, most apparent at C5-C6. Mild multilevel facet arthropathy. Visualized portions of the thorax again demonstrate extensive scarring in the apices of the lungs bilaterally, similar to the prior examination, presumably post infectious/inflammatory. IMPRESSION: 1. No acute intracranial abnormalities. 2. No evidence of significant acute traumatic injury to the cervical spine. 3. Moderate cerebral atrophy with ex vacuo dilatation of the ventricular system and extensive chronic microvascular ischemic changes in the cerebral white matter redemonstrated, as above. 4. 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 5. Multilevel degenerative disc disease and cervical spondylosis. Electronically Signed   By: Trudie Reed M.D.   On: 05/17/2016 12:46   I have personally reviewed and evaluated these images and lab results as part of my medical decision-making.   EKG Interpretation   Date/Time:  Wednesday May 17 2016 12:15:53 EDT Ventricular Rate:  87 PR Interval:    QRS Duration: 144 QT Interval:  480 QTC Calculation: 659 R Axis:   17 Text Interpretation:  Sinus tachycardia Multiform ventricular premature  complexes Sinus pause with ventricular escape Aberrant conduction of SV  complex(es) Left bundle branch block Confirmed by Laquilla Dault  MD, Jomarie Longs  [phone]) on 05/17/2016 3:36:30 PM      MDM   Final diagnoses:  Fall, initial encounter    Fall with no obvious injuries. CT of head and neck unremarkable. Labs unremarkable. Patient is at her baseline with Alzheimer. She'll be discharged back to the rehabilitation center    Bethann Berkshire, MD 05/17/16 1541

## 2016-05-17 NOTE — ED Notes (Signed)
[facility] after [facility]. Per EMS, pt was in group activity and witnesses say pt had mini seiz, tripped over shoe and fell. Pt does have hx of mini-seiz, also takes Coumadin and has Alz. Per EMS, no visual signs of injury, pt is at baseline with Alz. Pt arrived in neck collar, alert to self only.

## 2016-05-17 NOTE — ED Notes (Signed)
Pt taken to CT.

## 2016-06-06 ENCOUNTER — Encounter: Payer: Self-pay | Admitting: Neurology

## 2016-06-06 ENCOUNTER — Telehealth: Payer: Self-pay | Admitting: *Deleted

## 2016-06-06 ENCOUNTER — Ambulatory Visit (INDEPENDENT_AMBULATORY_CARE_PROVIDER_SITE_OTHER): Payer: Medicare Other | Admitting: Neurology

## 2016-06-06 VITALS — BP 95/60 | HR 58

## 2016-06-06 DIAGNOSIS — F0391 Unspecified dementia with behavioral disturbance: Secondary | ICD-10-CM

## 2016-06-06 DIAGNOSIS — R569 Unspecified convulsions: Secondary | ICD-10-CM | POA: Insufficient documentation

## 2016-06-06 DIAGNOSIS — F0390 Unspecified dementia without behavioral disturbance: Secondary | ICD-10-CM | POA: Insufficient documentation

## 2016-06-06 MED ORDER — DIVALPROEX SODIUM 500 MG PO DR TAB: 1000.0000 mg | DELAYED_RELEASE_TABLET | Freq: Two times a day (BID) | ORAL | Status: DC

## 2016-06-06 NOTE — Progress Notes (Signed)
Chief Complaint  Patient presents with  . Seizures    Tanya Rivas, aid from [name], is here with pt today.  She is unsure if the patient has had any seizures.  Reports seeing her shake before but says she can stop the movement when asked.  . Memory Loss    Tanya Rivas says the patient's confusion, agitation and memory have worsened.  She is unable to complete MMSE today.      PATIENT: Tanya Rivas DOB: [birth year]    HISTORICAL  Tanya Rivas is a 65 years old female, right-handed, referred by her primary care physician Dr. [name] for evaluation of seizure, she is accompanied by her assistant living staff Tanya Rivas.  She had a history of hepatitis C, hypertension, dementia, also long-standing history of seizure, she was not able to provide any meaningful history, she used to live with her daughter, her only child, moved to current assistant living [name] since April 2016.  She was taking Keppra 500 mg twice a day, had 2 recurrent seizure, the first one was in April 2016, second one was Apr 15 2015, she was noted to stare into space, followed by whole-body convulsion, last about 10 minutes, post event confusion, she was taken to the hospital, I have reviewed CAT scan of the brain without contrast, generalized atrophy, periventricular small vessel disease.  Laboratory showed elevated creatinine 1.34, hemoglobin 10.5.  She was put on higher dose of Keppra 750 twice a day, tolerating it well, no recurrent seizure,  At baseline, she ambulate without difficulty, able to carry on a conversation, daughter called facility sometimes, she can feed, dress herself without assistance. She is also taking Namenda, Aricept.   UPDATE June 08 2015: She came in with her caregiver Tanya Rivas at today's clinical visit, she continues to have recurrent seizure, but neither patient or Tanya Rivas could give me detail on how frequent, or how long each episode last. She is taking Keppra 750 mg twice a  day, continue have worsening memory trouble.  Mini-Mental Status Examination is only 8 out of 30 today, she is on Aricept 10 mg daily, Namenda xr 14 mg every day  I have personally reviewed MRI of the brain, moderate atrophy, mesial temporal lobe atrophy, mild periventricular small vessel disease, EEG, generalized moderate background slowing  UPDATE June 06 2016: She is accompanied by her staff Tanya Rivas from [name] assisted living at today's clinical visit, patient continued to have spells of seizure-like activity, but was able to stop activity by prompt, no loss of consciousness, no loss of bowel and bladder incontinence, no tongue biting.  She also has significant memory loss. She is not cooperative on today's Mini-Mental status examination, per staff member, she does have agitation, agitated easily,  I reviewed emergency visit note on June 20 first 2017, she was taken by ambulance to emergency room after had witnessed fall at her facility, with body shaking, seizure-like activity, but there was a question of loss of consciousness  I personally reviewed CAT scan of the brain without contrast: no acute intracranial abnormality significant atrophy, ventriculomegaly, CAT scan of the cervical spine: Multilevel degenerative disc disease no significant canal foraminal stenosis  Laboratory evaluation CBC showed mild anemia hemoglobin 11 point 7, creatinine 1.2 otherwise normal CMP, negative troponin, UA   REVIEW OF SYSTEMS: Full 14 system review of systems performed and notable only for seizure ALLERGIES: No Known Allergies  HOME MEDICATIONS: Current Outpatient Prescriptions  Medication Sig Dispense Refill  . acetaminophen (TYLENOL) 500 MG  tablet Take 500 mg by mouth 2 (two) times daily.     Marland Kitchen acetaminophen (TYLENOL) 500 MG tablet Take 500 mg by mouth every 4 (four) hours as needed for mild pain, fever or headache.    Marland Kitchen alum & mag hydroxide-simeth (MINTOX) 200-200-20 MG/5ML suspension  Take 30 mLs by mouth as needed for indigestion or heartburn.    Marland Kitchen amLODipine (NORVASC) 10 MG tablet Take 10 mg by mouth daily.    Marland Kitchen aspirin 81 MG chewable tablet Chew 81 mg by mouth daily.    . benazepril (LOTENSIN) 10 MG tablet Take 10 mg by mouth daily.    . cholecalciferol (VITAMIN D) 1000 UNITS tablet Take 1,000 Units by mouth daily.    . clopidogrel (PLAVIX) 75 MG tablet Take 75 mg by mouth daily.    Marland Kitchen donepezil (ARICEPT) 10 MG tablet Take 10 mg by mouth at bedtime.    . Fluticasone-Salmeterol (ADVAIR) 250-50 MCG/DOSE AEPB Inhale 1 puff into the lungs 2 (two) times daily.    . furosemide (LASIX) 20 MG tablet Take 20 mg by mouth daily.    Marland Kitchen guaifenesin (ROBAFEN) 100 MG/5ML syrup Take 200 mg by mouth every 6 (six) hours as needed for cough.    . isosorbide mononitrate (IMDUR) 60 MG 24 hr tablet Take 60 mg by mouth daily.    Marland Kitchen levETIRAcetam (KEPPRA) 750 MG tablet One in am and 2 tabs po qhs (Patient taking differently: Take 750-1,500 mg by mouth 2 (two) times daily. One in am and 2 tabs po qhs) 90 tablet 11  . loperamide (IMODIUM) 2 MG capsule Take 2 mg by mouth as needed for diarrhea or loose stools.    Marland Kitchen LORazepam (ATIVAN) 0.5 MG tablet Take 0.5 mg by mouth 2 (two) times daily.    Marland Kitchen LORazepam (ATIVAN) 0.5 MG tablet Take 0.5 mg by mouth every 8 (eight) hours as needed for anxiety.    . magnesium hydroxide (MILK OF MAGNESIA) 400 MG/5ML suspension Take 30 mLs by mouth at bedtime as needed for mild constipation.    . Melatonin 3 MG TABS Take 1 tablet by mouth at bedtime.    . memantine (NAMENDA) 10 MG tablet Take 1 tablet (10 mg total) by mouth 2 (two) times daily. 60 tablet 11  . metoprolol (LOPRESSOR) 50 MG tablet Take 50 mg by mouth 2 (two) times daily.    . mirtazapine (REMERON) 15 MG tablet Take 7.5 mg by mouth at bedtime.     . Multiple Vitamin (DAILY VITE) TABS Take 1 tablet by mouth daily.    Marland Kitchen neomycin-bacitracin-polymyxin (NEOSPORIN) [phone] ointment Apply 1 application topically  daily as needed (skin tears or abrasions.).     Marland Kitchen PARoxetine (PAXIL) 10 MG tablet Take 10 mg by mouth at bedtime.    Marland Kitchen QUEtiapine (SEROQUEL) 50 MG tablet Take 50 mg by mouth 2 (two) times daily.    . ranolazine (RANEXA) 1000 MG SR tablet Take 1,000 mg by mouth 2 (two) times daily.    Marland Kitchen thiamine (VITAMIN B-1) 100 MG tablet Take 100 mg by mouth daily.    Marland Kitchen tiotropium (SPIRIVA) 18 MCG inhalation capsule Place 18 mcg into inhaler and inhale daily.    . traZODone (DESYREL) 50 MG tablet Take 50 mg by mouth at bedtime.    . VENTOLIN HFA 108 (90 BASE) MCG/ACT inhaler Inhale 1 puff into the lungs every 4 (four) hours as needed for shortness of breath.      No current facility-administered medications for this visit.  PAST MEDICAL HISTORY: Past Medical History  Diagnosis Date  . Alzheimer disease   . Seizures (HCC)   . Hypertension   . COPD (chronic obstructive pulmonary disease) (HCC)   . Hepatitis C carrier     PAST SURGICAL HISTORY: Past Surgical History  Procedure Laterality Date  . Abdominal hysterectomy      FAMILY HISTORY: Family History  Problem Relation Age of Onset  . Family history unknown: Yes    SOCIAL HISTORY:  Social History   Social History  . Marital Status: Single    Spouse Name: N/A  . Number of Children: 1  . Years of Education: Some HS   Occupational History  . Disabled    Social History Main Topics  . Smoking status: Never Smoker   . Smokeless tobacco: Not on file  . Alcohol Use: No  . Drug Use: No  . Sexual Activity: Not on file   Other Topics Concern  . Not on file   Social History Narrative   Lives at [facility].   Right-handed.   Occasional caffeine use.     PHYSICAL EXAM   Filed Vitals:   06/06/16 1458  BP: 95/60  Pulse: 58    Not recorded      There is no weight on file to calculate BMI.  PHYSICAL EXAMNIATION:  Gen: NAD, conversant, well nourised, obese, well groomed                       Cardiovascular: Regular rate rhythm, no peripheral edema, warm, nontender. Eyes: Conjunctivae clear without exudates or hemorrhage Neck: Supple, no carotid bruise. Pulmonary: Clear to auscultation bilaterally   NEUROLOGICAL EXAM:  MENTAL STATUS: Speech/Cognition  Variable effort on examination  CRANIAL NERVES: CN II: Visual fields are full to confrontation. Pupils were equal round reactive to light CN III, IV, VI: extraocular movement are normal. No ptosis. CN V: Facial sensation is intact to pinprick in all 3 divisions bilaterally. Corneal responses are intact.  CN VII: Face is symmetric with normal eye closure and smile. CN VIII: Hearing is normal to rubbing fingers CN IX, X: Palate elevates symmetrically. Phonation is normal. CN XI: Head turning and shoulder shrug are intact CN XII: Tongue is midline with normal movements and no atrophy.  MOTOR: There is no pronator drift of out-stretched arms. Muscle bulk and tone are normal. Muscle strength is normal.  REFLEXES: Reflexes are 2+ and symmetric at the biceps, triceps, knees, and ankles. Plantar responses are flexor.  SENSORY: Withdraw to pain  COORDINATION: Not cooperative on examinations  GAIT/STANCE: Need to push up to get up from seated position, wide based, ataxic gait  DIAGNOSTIC DATA (LABS, IMAGING, TESTING) - I reviewed patient records, labs, notes, testing and imaging myself where available.  Lab Results  Component Value Date   WBC 5.6 05/17/2016   HGB 11.7* 05/17/2016   HCT 36.6 05/17/2016   MCV 82.8 05/17/2016   PLT 148* 05/17/2016      Component Value Date/Time   NA 135 05/17/2016 1305   NA 134* 05/18/2012 1645   K 4.5 05/17/2016 1305   K 3.9 05/18/2012 1645   CL 105 05/17/2016 1305   CL 101 05/18/2012 1645   CO2 22 05/17/2016 1305   CO2 27 05/18/2012 1645   GLUCOSE 83 05/17/2016 1305   GLUCOSE 86 05/18/2012 1645   BUN 16 05/17/2016 1305   BUN 14 05/18/2012 1645   CREATININE 1.20* 05/17/2016  1305   CREATININE  1.14 05/18/2012 1645   CALCIUM 9.0 05/17/2016 1305   CALCIUM 8.9 05/18/2012 1645   PROT 7.8 05/17/2016 1305   PROT 8.3* 05/18/2012 1645   ALBUMIN 2.9* 05/17/2016 1305   ALBUMIN 3.2* 05/18/2012 1645   AST 23 05/17/2016 1305   AST 48* 05/18/2012 1645   ALT 19 05/17/2016 1305   ALT 44 05/18/2012 1645   ALKPHOS 60 05/17/2016 1305   ALKPHOS 120 05/18/2012 1645   BILITOT 0.5 05/17/2016 1305   BILITOT 0.3 05/18/2012 1645   GFRNONAA 46* 05/17/2016 1305   GFRNONAA 51* 05/18/2012 1645   GFRNONAA 55* 02/12/2012 1155   GFRAA 53* 05/17/2016 1305   GFRAA 60* 05/18/2012 1645   GFRAA >60 02/12/2012 1155   ASSESSMENT AND PLAN  Tanya Rivas is a 65 y.o. female   Complex partial seizure with secondary generalization  Continue have recurrent frequent seizure-like event,  complex partial seizure versus pseudoseizure  She has significant dementia with behavior issues,  We will start Keppra 750 mg 1 in the morning 2 at night start Depakote DR 500 mg 2 tablets twice a day  Dementia with behavior issues   Hope Depakote will help  Levert FeinsteinYijun Roby Donaway, M.D. Ph.D.  [facility] [address], [address] [place], [place] [address] Ph: [phone] Fax: [phone]

## 2016-06-06 NOTE — Telephone Encounter (Signed)
Patient arrived to her appt late and was seen in the office.

## 2016-06-06 NOTE — Patient Instructions (Signed)
Stop Keppra  Start Depakote ER 500mg  2 tabs twice a day  EEG.

## 2016-06-06 NOTE — Telephone Encounter (Signed)
No show - follow up appointment. 

## 2016-06-15 ENCOUNTER — Emergency Department (HOSPITAL_COMMUNITY): Payer: Medicare Other

## 2016-06-15 ENCOUNTER — Emergency Department (HOSPITAL_COMMUNITY)
Admission: EM | Admit: 2016-06-15 | Discharge: 2016-06-15 | Disposition: A | Discharge status: Home / Self Care | Payer: Medicare Other | Attending: Emergency Medicine | Admitting: Emergency Medicine

## 2016-06-15 ENCOUNTER — Encounter (HOSPITAL_COMMUNITY): Payer: Self-pay

## 2016-06-15 DIAGNOSIS — I10 Essential (primary) hypertension: Secondary | ICD-10-CM | POA: Diagnosis not present

## 2016-06-15 DIAGNOSIS — Z79899 Other long term (current) drug therapy: Secondary | ICD-10-CM | POA: Diagnosis not present

## 2016-06-15 DIAGNOSIS — Z7982 Long term (current) use of aspirin: Secondary | ICD-10-CM | POA: Insufficient documentation

## 2016-06-15 DIAGNOSIS — J449 Chronic obstructive pulmonary disease, unspecified: Secondary | ICD-10-CM | POA: Insufficient documentation

## 2016-06-15 DIAGNOSIS — F0391 Unspecified dementia with behavioral disturbance: Secondary | ICD-10-CM

## 2016-06-15 DIAGNOSIS — R569 Unspecified convulsions: Secondary | ICD-10-CM

## 2016-06-15 LAB — BASIC METABOLIC PANEL WITH GFR
Anion gap: 8 (ref 5–15)
BUN: 22 mg/dL — ABNORMAL HIGH (ref 6–20)
CO2: 27 mmol/L (ref 22–32)
Calcium: 8.7 mg/dL — ABNORMAL LOW (ref 8.9–10.3)
Chloride: 105 mmol/L (ref 101–111)
Creatinine, Ser: 1.34 mg/dL — ABNORMAL HIGH (ref 0.44–1.00)
GFR calc Af Amer: 47 mL/min — ABNORMAL LOW
GFR calc non Af Amer: 40 mL/min — ABNORMAL LOW
Glucose, Bld: 103 mg/dL — ABNORMAL HIGH (ref 65–99)
Potassium: 4.3 mmol/L (ref 3.5–5.1)
Sodium: 140 mmol/L (ref 135–145)

## 2016-06-15 LAB — CBC WITH DIFFERENTIAL/PLATELET
Basophils Absolute: 0 K/uL (ref 0.0–0.1)
Basophils Relative: 0 %
Eosinophils Absolute: 0 K/uL (ref 0.0–0.7)
Eosinophils Relative: 0 %
HCT: 38.9 % (ref 36.0–46.0)
Hemoglobin: 12.3 g/dL (ref 12.0–15.0)
Lymphocytes Relative: 58 %
Lymphs Abs: 3.3 K/uL (ref 0.7–4.0)
MCH: 27.7 pg (ref 26.0–34.0)
MCHC: 31.6 g/dL (ref 30.0–36.0)
MCV: 87.6 fL (ref 78.0–100.0)
Monocytes Absolute: 0.4 K/uL (ref 0.1–1.0)
Monocytes Relative: 7 %
Neutro Abs: 2 K/uL (ref 1.7–7.7)
Neutrophils Relative %: 35 %
Platelets: 160 K/uL (ref 150–400)
RBC: 4.44 MIL/uL (ref 3.87–5.11)
RDW: 14.6 % (ref 11.5–15.5)
WBC: 5.7 K/uL (ref 4.0–10.5)

## 2016-06-15 LAB — VALPROIC ACID LEVEL: Valproic Acid Lvl: 66 ug/mL (ref 50.0–100.0)

## 2016-06-15 NOTE — ED Notes (Signed)
Per EMS- [place] [address] [place] [address]S reports that they tried to get report from a nurse or person in charge ,but no one available. A housekeeping person reported that the patient had shaking in their arms, but otherwise was acting normal. Patient currently taking depakote. Patient oriented to self and birthday. Patient does have a history of dementia.

## 2016-06-15 NOTE — Discharge Instructions (Signed)
Dementia Dementia is a general term for problems with brain function. A person with dementia has memory loss and a hard time with at least one other brain function such as thinking, speaking, or problem solving. Dementia can affect social functioning, how you do your job, your mood, or your personality. The changes may be hidden for a long time. The earliest forms of this disease are usually not detected by family or friends. Dementia can be:  Irreversible.  Potentially reversible.  Partially reversible.  Progressive. This means it can get worse over time. CAUSES  Irreversible dementia causes may include:  Degeneration of brain cells (Alzheimer disease or Lewy body dementia).  Multiple small strokes (vascular dementia).  Infection (chronic meningitis or Creutzfeldt-Jakob disease).  Frontotemporal dementia. This affects younger people, age 40 to 70, compared to those who have Alzheimer disease.  Dementia associated with other disorders like Parkinson disease, Huntington disease, or HIV-associated dementia. Potentially or partially reversible dementia causes may include:  Medicines.  Metabolic causes such as excessive alcohol intake, vitamin B12 deficiency, or thyroid disease.  Masses or pressure in the brain such as a tumor, blood clot, or hydrocephalus. SIGNS AND SYMPTOMS  Symptoms are often hard to detect. Family members or coworkers may not notice them early in the disease process. Different people with dementia may have different symptoms. Symptoms can include:  A hard time with memory, especially recent memory. Long-term memory may not be impaired.  Asking the same question multiple times or forgetting something someone just said.  A hard time speaking your thoughts or finding certain words.  A hard time solving problems or performing familiar tasks (such as how to use a telephone).  Sudden changes in mood.  Changes in personality, especially increasing moodiness or  mistrust.  Depression.  A hard time understanding complex ideas that were never a problem in the past. DIAGNOSIS  There are no specific tests for dementia.   Your health care provider may recommend a thorough evaluation. This is because some forms of dementia can be reversible. The evaluation will likely include a physical exam and getting a detailed history from you and a family member. The history often gives the best clues and suggestions for a diagnosis.  Memory testing may be done. A detailed brain function evaluation called neuropsychologic testing may be helpful.  Lab tests and brain imaging (such as a CT scan or MRI scan) are sometimes important.  Sometimes observation and re-evaluation over time is very helpful. TREATMENT  Treatment depends on the cause.   If the problem is a vitamin deficiency, it may be helped or cured with supplements.  For dementias such as Alzheimer disease, medicines are available to stabilize or slow the course of the disease. There are no cures for this type of dementia.  Your health care provider can help direct you to groups, organizations, and other health care providers to help with decisions in the care of you or your loved one. HOME CARE INSTRUCTIONS The care of individuals with dementia is varied and dependent upon the progression of the dementia. The following suggestions are intended for the person living with, or caring for, the person with dementia.  Create a safe environment.  Remove the locks on bathroom doors to prevent the person from accidentally locking himself or herself in.  Use childproof latches on kitchen cabinets and any place where cleaning supplies, chemicals, or alcohol are kept.  Use childproof covers in unused electrical outlets.  Install childproof devices to keep doors and windows   secured.  Remove stove knobs or install safety knobs and an automatic shut-off on the stove.  Lower the temperature on water  heaters.  Label medicines and keep them locked up.  Secure knives, lighters, matches, power tools, and guns, and keep these items out of reach.  Keep the house free from clutter. Remove rugs or anything that might contribute to a fall.  Remove objects that might break and hurt the person.  Make sure lighting is good, both inside and outside.  Install grab rails as needed.  Use a monitoring device to alert you to falls or other needs for help.  Reduce confusion.  Keep familiar objects and people around.  Use night lights or dim lights at night.  Label items or areas.  Use reminders, notes, or directions for daily activities or tasks.  Keep a simple, consistent routine for waking, meals, bathing, dressing, and bedtime.  Create a calm, quiet environment.  Place large clocks and calendars prominently.  Display emergency numbers and home address near all telephones.  Use cues to establish different times of the day. An example is to open curtains to let the natural light in during the day.   Use effective communication.  Choose simple words and short sentences.  Use a gentle, calm tone of voice.  Be careful not to interrupt.  If the person is struggling to find a word or communicate a thought, try to provide the word or thought.  Ask one question at a time. Allow the person ample time to answer questions. Repeat the question again if the person does not respond.  Reduce nighttime restlessness.  Provide a comfortable bed.  Have a consistent nighttime routine.  Ensure a regular walking or physical activity schedule. Involve the person in daily activities as much as possible.  Limit napping during the day.  Limit caffeine.  Attend social events that stimulate rather than overwhelm the senses.  Encourage good nutrition and hydration.  Reduce distractions during meal times and snacks.  Avoid foods that are too hot or too cold.  Monitor chewing and swallowing  ability.  Continue with routine vision, hearing, dental, and medical screenings.  Give medicines only as directed by the health care provider.  Monitor driving abilities. Do not allow the person to drive when safe driving is no longer possible.  Register with an identification program which could provide location assistance in the event of a missing person situation. SEEK MEDICAL CARE IF:   New behavioral problems start such as moodiness, aggressiveness, or seeing things that are not there (hallucinations).  Any new problem with brain function happens. This includes problems with balance, speech, or falling a lot.  Problems with swallowing develop.  Any symptoms of other illness happen. Small changes or worsening in any aspect of brain function can be a sign that the illness is getting worse. It can also be a sign of another medical illness such as infection. Seeing a health care provider right away is important. SEEK IMMEDIATE MEDICAL CARE IF:   A fever develops.  New or worsened confusion develops.  New or worsened sleepiness develops.  Staying awake becomes hard to do.   This information is not intended to replace advice given to you by your health care provider. Make sure you discuss any questions you have with your health care provider.   Document Released: 05/09/2001 Document Revised: 12/04/2014 Document Reviewed: 04/10/2011 Elsevier Interactive Patient Education [employer]2016 [employer] Inc.  Seizure, Adult A seizure is abnormal electrical activity in the  brain. Seizures usually last from 30 seconds to 2 minutes. There are various types of seizures. Before a seizure, you may have a warning sensation (aura) that a seizure is about to occur. An aura may include the following symptoms:   Fear or anxiety.  Nausea.  Feeling like the room is spinning (vertigo).  Vision changes, such as seeing flashing lights or spots. Common symptoms during a seizure include:  A change in  attention or behavior (altered mental status).  Convulsions with rhythmic jerking movements.  Drooling.  Rapid eye movements.  Grunting.  Loss of bladder and bowel control.  Bitter taste in the mouth.  Tongue biting. After a seizure, you may feel confused and sleepy. You may also have an injury resulting from convulsions during the seizure. HOME CARE INSTRUCTIONS   If you are given medicines, take them exactly as prescribed by your health care provider.  Keep all follow-up appointments as directed by your health care provider.  Do not swim or drive or engage in risky activity during which a seizure could cause further injury to you or others until your health care provider says it is OK.  Get adequate rest.  Teach friends and family what to do if you have a seizure. They should:  Lay you on the ground to prevent a fall.  Put a cushion under your head.  Loosen any tight clothing around your neck.  Turn you on your side. If vomiting occurs, this helps keep your airway clear.  Stay with you until you recover.  Know whether or not you need emergency care. SEEK IMMEDIATE MEDICAL CARE IF:  The seizure lasts longer than 5 minutes.  The seizure is severe or you do not wake up immediately after the seizure.  You have an altered mental status after the seizure.  You are having more frequent or worsening seizures. Someone should drive you to the emergency department or call local emergency services (911 in [place] MAKE SURE YOU:  Understand these instructions.  Will watch your condition.  Will get help right away if you are not doing well or get worse.   This information is not intended to replace advice given to you by your health care provider. Make sure you discuss any questions you have with your health care provider.  Your depakote levels today are therapeutic. Continue home Keppra and Depakote as prescribed. No additional seizure like activity noted while in ED.  Follow up with PCP for re-evaluation. Return to the ED if you experience fall, loss of consciousness, pain, change in vision, repeat seizure.

## 2016-06-15 NOTE — ED Provider Notes (Signed)
CSN: [id number]     Arrival date & time 06/15/16  1346 History   First MD Initiated Contact with Patient 06/15/16 1349     Chief Complaint  Patient presents with  . seizure-llike activity      (Consider location/radiation/quality/duration/timing/severity/associated sxs/prior Treatment) HPI   Tanya Rivas is a [age] showed feel with history of Alzheimer's, seizures, HTN, hepatitis C, COPD who presents the ED today sent by her nursing facility for possible seizure-like activity. Level V caveat, dementia. Unable to speak with nurse from facility, only could talk to housekeeping person who states that they witnessed the patient sitting in her wheelchair and her arms were shaking. Patient was otherwise mentating at her baseline. Patient is currently taking Depakote. She is currently oriented to herself and her birthday but disoriented to time, place and situation.  Past Medical History  Diagnosis Date  . Alzheimer disease   . Seizures (HCC)   . Hypertension   . COPD (chronic obstructive pulmonary disease) (HCC)   . Hepatitis C carrier    Past Surgical History  Procedure Laterality Date  . Abdominal hysterectomy     Family History  Problem Relation Age of Onset  . Family history unknown: Yes   Social History  Substance Use Topics  . Smoking status: Never Smoker   . Smokeless tobacco: None  . Alcohol Use: No   OB History    No data available     Review of Systems  All other systems reviewed and are negative.     Allergies  Review of patient's allergies indicates no known allergies.  Home Medications   Prior to Admission medications   Medication Sig Start Date End Date Taking? Authorizing Provider  acetaminophen (TYLENOL) 500 MG tablet Take 500 mg by mouth every 4 (four) hours as needed for mild pain, fever or headache.    Historical Provider, MD  alum & mag hydroxide-simeth (MINTOX) 200-200-20 MG/5ML suspension Take 30 mLs by mouth as needed for indigestion or  heartburn.    Historical Provider, MD  amLODipine (NORVASC) 10 MG tablet Take 10 mg by mouth daily.    Historical Provider, MD  aspirin 81 MG chewable tablet Chew 81 mg by mouth daily.    Historical Provider, MD  benazepril (LOTENSIN) 10 MG tablet Take 10 mg by mouth daily.    Historical Provider, MD  cholecalciferol (VITAMIN D) 1000 UNITS tablet Take 1,000 Units by mouth daily.    Historical Provider, MD  divalproex (DEPAKOTE) 500 MG DR tablet Take 2 tablets (1,000 mg total) by mouth 2 (two) times daily. 06/06/16   Levert FeinsteinYijun Yan, MD  donepezil (ARICEPT) 10 MG tablet Take 10 mg by mouth at bedtime.    Historical Provider, MD  Fluticasone-Salmeterol (ADVAIR) 250-50 MCG/DOSE AEPB Inhale 1 puff into the lungs 2 (two) times daily.    Historical Provider, MD  furosemide (LASIX) 20 MG tablet Take 20 mg by mouth daily.    Historical Provider, MD  guaifenesin (ROBAFEN) 100 MG/5ML syrup Take 200 mg by mouth every 6 (six) hours as needed for cough.    Historical Provider, MD  isosorbide mononitrate (IMDUR) 60 MG 24 hr tablet Take 60 mg by mouth daily.    Historical Provider, MD  levETIRAcetam (KEPPRA) 750 MG tablet One in am and 2 tabs po qhs Patient taking differently: Take 750-1,500 mg by mouth 2 (two) times daily. One in am and 2 tabs po qhs 06/08/15   Levert FeinsteinYijun Yan, MD  loperamide (IMODIUM) 2 MG capsule Take 2  mg by mouth as needed for diarrhea or loose stools.    Historical Provider, MD  LORazepam (ATIVAN) 0.5 MG tablet Take 0.5 mg by mouth 2 (two) times daily.    Historical Provider, MD  LORazepam (ATIVAN) 0.5 MG tablet Take 0.5 mg by mouth every 8 (eight) hours as needed for anxiety.    Historical Provider, MD  magnesium hydroxide (MILK OF MAGNESIA) 400 MG/5ML suspension Take 30 mLs by mouth at bedtime as needed for mild constipation.    Historical Provider, MD  Melatonin 3 MG TABS Take 1 tablet by mouth at bedtime.    Historical Provider, MD  memantine (NAMENDA) 10 MG tablet Take 1 tablet (10 mg total) by  mouth 2 (two) times daily. 06/08/15   Levert Feinstein, MD  metoprolol (LOPRESSOR) 50 MG tablet Take 50 mg by mouth 2 (two) times daily.    Historical Provider, MD  mirtazapine (REMERON) 15 MG tablet Take 7.5 mg by mouth at bedtime.     Historical Provider, MD  Multiple Vitamin (DAILY VITE) TABS Take 1 tablet by mouth daily.    Historical Provider, MD  neomycin-bacitracin-polymyxin (NEOSPORIN) [phone] ointment Apply 1 application topically daily as needed (skin tears or abrasions.).     Historical Provider, MD  PARoxetine (PAXIL) 10 MG tablet Take 10 mg by mouth at bedtime.    Historical Provider, MD  QUEtiapine (SEROQUEL) 50 MG tablet Take 50 mg by mouth 2 (two) times daily.    Historical Provider, MD  ranolazine (RANEXA) 1000 MG SR tablet Take 1,000 mg by mouth 2 (two) times daily.    Historical Provider, MD  thiamine (VITAMIN B-1) 100 MG tablet Take 100 mg by mouth daily.    Historical Provider, MD  tiotropium (SPIRIVA) 18 MCG inhalation capsule Place 18 mcg into inhaler and inhale daily.    Historical Provider, MD  traZODone (DESYREL) 50 MG tablet Take 50 mg by mouth at bedtime.    Historical Provider, MD  VENTOLIN HFA 108 (90 BASE) MCG/ACT inhaler Inhale 1 puff into the lungs every 4 (four) hours as needed for shortness of breath.  03/29/15   Historical Provider, MD   BP 126/83 mmHg  Pulse 59  Temp(Src) 97.8 F (36.6 C) (Oral)  Resp 16  SpO2 97% Physical Exam  Constitutional: No distress.  Elderly female lying in bed  HENT:  Head: Normocephalic and atraumatic.  Mouth/Throat: No oropharyngeal exudate.  Eyes: Conjunctivae and EOM are normal. Pupils are equal, round, and reactive to light. Right eye exhibits no discharge. Left eye exhibits no discharge. No scleral icterus.  Cardiovascular: Normal rate, regular rhythm, normal heart sounds and intact distal pulses.  Exam reveals no gallop and no friction rub.   No murmur heard. Pulmonary/Chest: Effort normal and breath sounds normal. No  respiratory distress. She has no wheezes. She has no rales. She exhibits no tenderness.  Abdominal: Soft. She exhibits no distension. There is no tenderness. There is no guarding.  Musculoskeletal: Normal range of motion. She exhibits no edema.  Neurological: She is alert.  Oriented to self. Disoriented to place, time and situation. Tangential speech. Unable to follow commands. Moving all 4 extremities without difficulty.  Skin: Skin is warm and dry. No rash noted. She is not diaphoretic. No erythema. No pallor.  Psychiatric: She has a normal mood and affect. Her behavior is normal.  Nursing note and vitals reviewed.   ED Course  Procedures (including critical care time) Labs Review Labs Reviewed  BASIC METABOLIC PANEL - Abnormal; Notable for  the following:    Glucose, Bld 103 (*)    BUN 22 (*)    Creatinine, Ser 1.34 (*)    Calcium 8.7 (*)    GFR calc non Af Amer 40 (*)    GFR calc Af Amer 47 (*)    All other components within normal limits  VALPROIC ACID LEVEL  CBC WITH DIFFERENTIAL/PLATELET    Imaging Review Ct Head Wo Contrast  06/15/2016  CLINICAL DATA:  Seizure-like activity today. EXAM: CT HEAD WITHOUT CONTRAST TECHNIQUE: Contiguous axial images were obtained from the base of the skull through the vertex without intravenous contrast. COMPARISON:  04/15/2015 FINDINGS: There is no evidence of intracranial hemorrhage, brain edema, or other signs of acute infarction. There is no evidence of intracranial mass lesion or mass effect. No abnormal extraaxial fluid collections are identified. Moderate diffuse cerebral atrophy and chronic small vessel disease again noted. Ventriculomegaly shows no significant change. No skull abnormality identified. IMPRESSION: No acute intracranial findings. No significant change in diffuse cerebral atrophy and chronic small vessel disease. Electronically Signed   By: Myles Rosenthal M.D.   On: 06/15/2016 15:27   I have personally reviewed and evaluated these  images and lab results as part of my medical decision-making.   EKG Interpretation None      MDM   Final diagnoses:  Dementia, with behavioral disturbance  Seizure-like activity (HCC)    65 year old female with history of dementia, seizures who presents the ED sent by her nursing facility for possible seizure-like activity. I was unable to speak to nurses taking care of patient.   ED nurse spoke with the housekeeper at patient's facility who states he witnessed the patient in her wheelchair and shaking her arms for a few minutes. She was mentating at her baseline. Presentation to ED patient is disoriented. No seizure like activity in ED. All vitals are stable. CT head obtained a seizure appears to be largely unwitnessed which was unremarkable. All lab work unremarkable as well. Valproic acid level is therapeutic. No additional dose given while in the ED. Recommend follow-up with PCP as needed as well as neurologist. Continue home Keppra and Depakote as prescribed.  Patient was discussed with and seen by Dr. Criss Alvine who agrees with the treatment plan.    Lester Kinsman [place], PA-C 06/15/16 2053  Pricilla Loveless, MD 06/16/16 [phone]

## 2016-06-15 NOTE — ED Notes (Signed)
Patient transported to CT 

## 2016-06-15 NOTE — ED Notes (Signed)
[facility]

## 2016-06-15 NOTE — ED Notes (Signed)
Bed: [id number] Expected date:  Expected time:  Means of arrival:  Comments: EMS- 65yo F, Seizure-like activity

## 2016-06-16 ENCOUNTER — Emergency Department (HOSPITAL_COMMUNITY): Payer: Medicare Other

## 2016-06-16 ENCOUNTER — Emergency Department (HOSPITAL_COMMUNITY)
Admission: EM | Admit: 2016-06-16 | Discharge: 2016-06-16 | Disposition: A | Discharge status: Skilled Nursing Facility | Payer: Medicare Other | Attending: Emergency Medicine | Admitting: Emergency Medicine

## 2016-06-16 ENCOUNTER — Encounter (HOSPITAL_COMMUNITY): Payer: Self-pay | Admitting: Emergency Medicine

## 2016-06-16 DIAGNOSIS — I10 Essential (primary) hypertension: Secondary | ICD-10-CM | POA: Insufficient documentation

## 2016-06-16 DIAGNOSIS — Y999 Unspecified external cause status: Secondary | ICD-10-CM | POA: Diagnosis not present

## 2016-06-16 DIAGNOSIS — Y939 Activity, unspecified: Secondary | ICD-10-CM | POA: Insufficient documentation

## 2016-06-16 DIAGNOSIS — W1800XA Striking against unspecified object with subsequent fall, initial encounter: Secondary | ICD-10-CM | POA: Diagnosis not present

## 2016-06-16 DIAGNOSIS — W19XXXA Unspecified fall, initial encounter: Secondary | ICD-10-CM

## 2016-06-16 DIAGNOSIS — R51 Headache: Secondary | ICD-10-CM | POA: Diagnosis not present

## 2016-06-16 DIAGNOSIS — Z7982 Long term (current) use of aspirin: Secondary | ICD-10-CM | POA: Diagnosis not present

## 2016-06-16 DIAGNOSIS — J449 Chronic obstructive pulmonary disease, unspecified: Secondary | ICD-10-CM | POA: Insufficient documentation

## 2016-06-16 DIAGNOSIS — W050XXA Fall from non-moving wheelchair, initial encounter: Secondary | ICD-10-CM | POA: Diagnosis not present

## 2016-06-16 DIAGNOSIS — Y929 Unspecified place or not applicable: Secondary | ICD-10-CM | POA: Insufficient documentation

## 2016-06-16 DIAGNOSIS — G309 Alzheimer's disease, unspecified: Secondary | ICD-10-CM | POA: Diagnosis not present

## 2016-06-16 DIAGNOSIS — Z79899 Other long term (current) drug therapy: Secondary | ICD-10-CM | POA: Insufficient documentation

## 2016-06-16 NOTE — ED Notes (Signed)
Bed: WHALD Expected date:  Expected time:  Means of arrival:  Comments: 

## 2016-06-16 NOTE — ED Provider Notes (Signed)
CSN: [id number]     Arrival date & time 06/16/16  1556 History   First MD Initiated Contact with Patient 06/16/16 1625     Chief Complaint  Patient presents with  . Fall     (Consider location/radiation/quality/duration/timing/severity/associated sxs/prior Treatment) Patient is a 65 y.o. female presenting with fall.  Fall This is a new problem. The current episode started 1 to 2 hours ago. The problem occurs constantly. The problem has been resolved. Associated symptoms include headaches (head pain). Pertinent negatives include no chest pain and no shortness of breath. Nothing aggravates the symptoms. Nothing relieves the symptoms. She has tried nothing for the symptoms.    Past Medical History  Diagnosis Date  . Alzheimer disease   . Seizures (HCC)   . Hypertension   . COPD (chronic obstructive pulmonary disease) (HCC)   . Hepatitis C carrier    Past Surgical History  Procedure Laterality Date  . Abdominal hysterectomy     Family History  Problem Relation Age of Onset  . Family history unknown: Yes   Social History  Substance Use Topics  . Smoking status: Never Smoker   . Smokeless tobacco: None  . Alcohol Use: No   OB History    No data available     Review of Systems  HENT: Negative for congestion.   Respiratory: Negative for cough and shortness of breath.   Cardiovascular: Negative for chest pain.  Neurological: Positive for headaches (head pain). Negative for seizures.  All other systems reviewed and are negative.     Allergies  Review of patient's allergies indicates no known allergies.  Home Medications   Prior to Admission medications   Medication Sig Start Date End Date Taking? Authorizing Provider  acetaminophen (TYLENOL) 500 MG tablet Take 500 mg by mouth every 4 (four) hours as needed for mild pain, fever or headache.   Yes Historical Provider, MD  acetaminophen (TYLENOL) 500 MG tablet Take 500 mg by mouth 2 (two) times daily. Scheduled   Yes  Historical Provider, MD  alum & mag hydroxide-simeth (MINTOX) 200-200-20 MG/5ML suspension Take 30 mLs by mouth as needed for indigestion or heartburn.   Yes Historical Provider, MD  amLODipine (NORVASC) 10 MG tablet Take 10 mg by mouth daily.   Yes Historical Provider, MD  aspirin 81 MG chewable tablet Chew 81 mg by mouth daily.   Yes Historical Provider, MD  benazepril (LOTENSIN) 10 MG tablet Take 10 mg by mouth daily.   Yes Historical Provider, MD  cholecalciferol (VITAMIN D) 1000 UNITS tablet Take 1,000 Units by mouth daily.   Yes Historical Provider, MD  divalproex (DEPAKOTE) 500 MG DR tablet Take 2 tablets (1,000 mg total) by mouth 2 (two) times daily. 06/06/16  Yes Levert FeinsteinYijun Yan, MD  donepezil (ARICEPT) 10 MG tablet Take 10 mg by mouth at bedtime.   Yes Historical Provider, MD  Fluticasone-Salmeterol (ADVAIR) 250-50 MCG/DOSE AEPB Inhale 1 puff into the lungs 2 (two) times daily.   Yes Historical Provider, MD  furosemide (LASIX) 20 MG tablet Take 20 mg by mouth daily.   Yes Historical Provider, MD  guaifenesin (ROBAFEN) 100 MG/5ML syrup Take 200 mg by mouth every 6 (six) hours as needed for cough.   Yes Historical Provider, MD  isosorbide mononitrate (IMDUR) 60 MG 24 hr tablet Take 60 mg by mouth daily.   Yes Historical Provider, MD  loperamide (IMODIUM) 2 MG capsule Take 2 mg by mouth as needed for diarrhea or loose stools.   Yes Historical Provider,  MD  LORazepam (ATIVAN) 0.5 MG tablet Take 0.5 mg by mouth 2 (two) times daily.   Yes Historical Provider, MD  LORazepam (ATIVAN) 0.5 MG tablet Take 0.5 mg by mouth every 8 (eight) hours as needed for anxiety.   Yes Historical Provider, MD  magnesium hydroxide (MILK OF MAGNESIA) 400 MG/5ML suspension Take 30 mLs by mouth at bedtime as needed for mild constipation.   Yes Historical Provider, MD  Melatonin 3 MG TABS Take 1 tablet by mouth at bedtime.   Yes Historical Provider, MD  memantine (NAMENDA) 10 MG tablet Take 1 tablet (10 mg total) by mouth 2  (two) times daily. 06/08/15  Yes Levert Feinstein, MD  metoprolol (LOPRESSOR) 50 MG tablet Take 50 mg by mouth 2 (two) times daily.   Yes Historical Provider, MD  Multiple Vitamin (DAILY VITE) TABS Take 1 tablet by mouth daily.   Yes Historical Provider, MD  neomycin-bacitracin-polymyxin (NEOSPORIN) [phone] ointment Apply 1 application topically daily as needed (skin tears or abrasions.).    Yes Historical Provider, MD  PARoxetine (PAXIL) 10 MG tablet Take 15 mg by mouth at bedtime.    Yes Historical Provider, MD  QUEtiapine (SEROQUEL) 50 MG tablet Take 50 mg by mouth 2 (two) times daily.   Yes Historical Provider, MD  ranolazine (RANEXA) 1000 MG SR tablet Take 1,000 mg by mouth 2 (two) times daily.   Yes Historical Provider, MD  thiamine (VITAMIN B-1) 100 MG tablet Take 100 mg by mouth daily.   Yes Historical Provider, MD  tiotropium (SPIRIVA) 18 MCG inhalation capsule Place 18 mcg into inhaler and inhale daily.   Yes Historical Provider, MD  traZODone (DESYREL) 50 MG tablet Take 50 mg by mouth at bedtime.   Yes Historical Provider, MD  VENTOLIN HFA 108 (90 BASE) MCG/ACT inhaler Inhale 1 puff into the lungs every 4 (four) hours as needed for shortness of breath.  03/29/15  Yes Historical Provider, MD  levETIRAcetam (KEPPRA) 750 MG tablet One in am and 2 tabs po qhs Patient not taking: Reported on 06/15/2016 06/08/15   Levert Feinstein, MD   BP 156/100 mmHg  Pulse 52  Temp(Src) 98.2 F (36.8 C) (Oral)  Resp 18  SpO2 95% Physical Exam  Constitutional: She appears well-developed and well-nourished.  HENT:  Head: Normocephalic and atraumatic.  Eyes: Conjunctivae are normal. Pupils are equal, round, and reactive to light.  Neck: Normal range of motion.  Cardiovascular: Normal rate and regular rhythm.   Pulmonary/Chest: Effort normal. No stridor. No respiratory distress. She has no wheezes.  Abdominal: Soft. She exhibits no distension. There is no tenderness.  Musculoskeletal: Normal range of motion. She  exhibits no edema or tenderness.  No cervical spine tenderness, thoracic spine tenderness or Lumbar spine tenderness.  No tenderness or pain with palpation and full ROM of all joints in upper and lower extremities.  No ecchymosis or other signs of trauma on back or extremities.  No Pain with AP or lateral compression of ribs.  No Paracervical ttp, paraspinal ttp   Neurological: She is alert. No cranial nerve deficit. Coordination normal.  Skin: Skin is warm and dry.  Nursing note and vitals reviewed.   ED Course  Procedures (including critical care time) Labs Review Labs Reviewed - No data to display  Imaging Review Ct Head Wo Contrast  06/16/2016  CLINICAL DATA:  Status post fall from wheelchair. EXAM: CT HEAD WITHOUT CONTRAST CT CERVICAL SPINE WITHOUT CONTRAST TECHNIQUE: Multidetector CT imaging of the head and cervical spine was  performed following the standard protocol without intravenous contrast. Multiplanar CT image reconstructions of the cervical spine were also generated. COMPARISON:  06/15/2016 FINDINGS: CT HEAD FINDINGS There is no evidence of mass effect, midline shift, or extra-axial fluid collections. There is no evidence of a space-occupying lesion or intracranial hemorrhage. There is no evidence of a cortical-based area of acute infarction. There is generalized cerebral atrophy. There is periventricular white matter low attenuation likely secondary to microangiopathy. The ventricles and sulci are appropriate for the patient's age. The basal cisterns are patent. Visualized portions of the orbits are unremarkable. The paranasal sinuses are clear. Mild right mastoid effusion. Cerebrovascular atherosclerotic calcifications are noted. The osseous structures are unremarkable. CT CERVICAL SPINE FINDINGS The alignment is anatomic. The vertebral body heights are maintained. There is no acute fracture. There is no static listhesis. The prevertebral soft tissues are normal. The intraspinal  soft tissues are not fully imaged on this examination due to poor soft tissue contrast, but there is no gross soft tissue abnormality. There is degenerative disc disease with disc height loss at C5-6. There is bilateral mild facet arthropathy at C2-3. There is right facet arthropathy at C3-4. There is moderate left facet arthropathy at C4-5. There is mild biapical chronic interstitial lung disease. There is bilateral carotid artery atherosclerosis. IMPRESSION: 1. No acute intracranial pathology. 2. No acute osseous injury of the cervical spine. Electronically Signed   By: Elige Ko   On: 06/16/2016 17:10   Ct Head Wo Contrast  06/15/2016  CLINICAL DATA:  Seizure-like activity today. EXAM: CT HEAD WITHOUT CONTRAST TECHNIQUE: Contiguous axial images were obtained from the base of the skull through the vertex without intravenous contrast. COMPARISON:  04/15/2015 FINDINGS: There is no evidence of intracranial hemorrhage, brain edema, or other signs of acute infarction. There is no evidence of intracranial mass lesion or mass effect. No abnormal extraaxial fluid collections are identified. Moderate diffuse cerebral atrophy and chronic small vessel disease again noted. Ventriculomegaly shows no significant change. No skull abnormality identified. IMPRESSION: No acute intracranial findings. No significant change in diffuse cerebral atrophy and chronic small vessel disease. Electronically Signed   By: Myles Rosenthal M.D.   On: 06/15/2016 15:27   Ct Cervical Spine Wo Contrast  06/16/2016  CLINICAL DATA:  Status post fall from wheelchair. EXAM: CT HEAD WITHOUT CONTRAST CT CERVICAL SPINE WITHOUT CONTRAST TECHNIQUE: Multidetector CT imaging of the head and cervical spine was performed following the standard protocol without intravenous contrast. Multiplanar CT image reconstructions of the cervical spine were also generated. COMPARISON:  06/15/2016 FINDINGS: CT HEAD FINDINGS There is no evidence of mass effect, midline  shift, or extra-axial fluid collections. There is no evidence of a space-occupying lesion or intracranial hemorrhage. There is no evidence of a cortical-based area of acute infarction. There is generalized cerebral atrophy. There is periventricular white matter low attenuation likely secondary to microangiopathy. The ventricles and sulci are appropriate for the patient's age. The basal cisterns are patent. Visualized portions of the orbits are unremarkable. The paranasal sinuses are clear. Mild right mastoid effusion. Cerebrovascular atherosclerotic calcifications are noted. The osseous structures are unremarkable. CT CERVICAL SPINE FINDINGS The alignment is anatomic. The vertebral body heights are maintained. There is no acute fracture. There is no static listhesis. The prevertebral soft tissues are normal. The intraspinal soft tissues are not fully imaged on this examination due to poor soft tissue contrast, but there is no gross soft tissue abnormality. There is degenerative disc disease with disc height loss at C5-6.  There is bilateral mild facet arthropathy at C2-3. There is right facet arthropathy at C3-4. There is moderate left facet arthropathy at C4-5. There is mild biapical chronic interstitial lung disease. There is bilateral carotid artery atherosclerosis. IMPRESSION: 1. No acute intracranial pathology. 2. No acute osseous injury of the cervical spine. Electronically Signed   By: Elige Ko   On: 06/16/2016 17:10   I have personally reviewed and evaluated these images and lab results as part of my medical decision-making.   EKG Interpretation None      MDM   Final diagnoses:  Fall, initial encounter   Non ambulatory at baseline. Tried gettign up out of chair and fell on face. Had face pain initailly. Now denying same. No pain elswewhere. Has significant dementia, so will ct to ensure no bleed or other injury. otherwise plan for discharge back to facility if negative.  Scans negative.  Likely still at neuro baseline as she arrived that way and hasn't changed. Will dc to facility.   New Prescriptions: New Prescriptions   No medications on file     I have personally and contemperaneously reviewed labs and imaging and used in my decision making as above.   A medical screening exam was performed and I feel the patient has had an appropriate workup for their chief complaint at this time and likelihood of emergent condition existing is low and thus workup can continue on an outpatient basis.. Their vital signs are stable. They have been counseled on decision, discharge, follow up and which symptoms necessitate immediate return to the emergency department.  They verbally stated understanding and agreement with plan and discharged in stable condition.      Marily Memos, MD 06/16/16 1755

## 2016-06-16 NOTE — ED Notes (Signed)
[facility].  She is wheelchair bound at baseline.  RN on scene stated that pt hit her face but no LOC.  Hx dementia.  Pt is at her neuro  baseline per staff at facility.

## 2016-06-16 NOTE — ED Notes (Signed)
MD at bedside. 

## 2016-06-16 NOTE — ED Notes (Signed)
[facility] notified of need for Pt transportation 

## 2016-06-21 ENCOUNTER — Emergency Department (HOSPITAL_COMMUNITY): Payer: Medicare Other

## 2016-06-21 ENCOUNTER — Emergency Department (HOSPITAL_COMMUNITY)
Admission: EM | Admit: 2016-06-21 | Discharge: 2016-06-21 | Disposition: A | Discharge status: Home / Self Care | Payer: Medicare Other | Attending: Emergency Medicine | Admitting: Emergency Medicine

## 2016-06-21 ENCOUNTER — Encounter (HOSPITAL_COMMUNITY): Payer: Self-pay | Admitting: *Deleted

## 2016-06-21 DIAGNOSIS — Z79899 Other long term (current) drug therapy: Secondary | ICD-10-CM | POA: Diagnosis not present

## 2016-06-21 DIAGNOSIS — Z7982 Long term (current) use of aspirin: Secondary | ICD-10-CM | POA: Insufficient documentation

## 2016-06-21 DIAGNOSIS — I10 Essential (primary) hypertension: Secondary | ICD-10-CM | POA: Diagnosis not present

## 2016-06-21 DIAGNOSIS — W19XXXA Unspecified fall, initial encounter: Secondary | ICD-10-CM | POA: Diagnosis not present

## 2016-06-21 DIAGNOSIS — G309 Alzheimer's disease, unspecified: Secondary | ICD-10-CM | POA: Insufficient documentation

## 2016-06-21 DIAGNOSIS — J449 Chronic obstructive pulmonary disease, unspecified: Secondary | ICD-10-CM | POA: Diagnosis not present

## 2016-06-21 DIAGNOSIS — Z043 Encounter for examination and observation following other accident: Secondary | ICD-10-CM | POA: Diagnosis not present

## 2016-06-21 DIAGNOSIS — Y929 Unspecified place or not applicable: Secondary | ICD-10-CM | POA: Diagnosis not present

## 2016-06-21 DIAGNOSIS — Y939 Activity, unspecified: Secondary | ICD-10-CM | POA: Diagnosis not present

## 2016-06-21 DIAGNOSIS — Y999 Unspecified external cause status: Secondary | ICD-10-CM | POA: Diagnosis not present

## 2016-06-21 DIAGNOSIS — Z7951 Long term (current) use of inhaled steroids: Secondary | ICD-10-CM | POA: Insufficient documentation

## 2016-06-21 LAB — URINALYSIS, ROUTINE W REFLEX MICROSCOPIC
Bilirubin Urine: NEGATIVE
GLUCOSE, UA: NEGATIVE mg/dL
Hgb urine dipstick: NEGATIVE
KETONES UR: 15 mg/dL — AB
LEUKOCYTES UA: NEGATIVE
Nitrite: NEGATIVE
PH: 7 (ref 5.0–8.0)
Protein, ur: NEGATIVE mg/dL
Specific Gravity, Urine: 1.016 (ref 1.005–1.030)

## 2016-06-21 LAB — CBC WITH DIFFERENTIAL/PLATELET
BASOS ABS: 0 10*3/uL (ref 0.0–0.1)
Basophils Relative: 0 %
Eosinophils Absolute: 0 10*3/uL (ref 0.0–0.7)
Eosinophils Relative: 0 %
HEMATOCRIT: 40.6 % (ref 36.0–46.0)
Hemoglobin: 12.9 g/dL (ref 12.0–15.0)
LYMPHS PCT: 57 %
Lymphs Abs: 2.8 10*3/uL (ref 0.7–4.0)
MCH: 27.6 pg (ref 26.0–34.0)
MCHC: 31.8 g/dL (ref 30.0–36.0)
MCV: 86.9 fL (ref 78.0–100.0)
Monocytes Absolute: 0.4 10*3/uL (ref 0.1–1.0)
Monocytes Relative: 8 %
NEUTROS ABS: 1.7 10*3/uL (ref 1.7–7.7)
NEUTROS PCT: 35 %
Platelets: 149 10*3/uL — ABNORMAL LOW (ref 150–400)
RBC: 4.67 MIL/uL (ref 3.87–5.11)
RDW: 15 % (ref 11.5–15.5)
WBC: 4.9 10*3/uL (ref 4.0–10.5)

## 2016-06-21 LAB — COMPREHENSIVE METABOLIC PANEL
ALBUMIN: 3.6 g/dL (ref 3.5–5.0)
ALT: 17 U/L (ref 14–54)
ANION GAP: 6 (ref 5–15)
AST: 29 U/L (ref 15–41)
Alkaline Phosphatase: 63 U/L (ref 38–126)
BILIRUBIN TOTAL: 0.8 mg/dL (ref 0.3–1.2)
BUN: 19 mg/dL (ref 6–20)
CO2: 29 mmol/L (ref 22–32)
Calcium: 9 mg/dL (ref 8.9–10.3)
Chloride: 107 mmol/L (ref 101–111)
Creatinine, Ser: 1.29 mg/dL — ABNORMAL HIGH (ref 0.44–1.00)
GFR, EST AFRICAN AMERICAN: 49 mL/min — AB (ref >60)
GFR, EST NON AFRICAN AMERICAN: 42 mL/min — AB (ref >60)
Glucose, Bld: 87 mg/dL (ref 65–99)
POTASSIUM: 3.8 mmol/L (ref 3.5–5.1)
Sodium: 142 mmol/L (ref 135–145)
TOTAL PROTEIN: 8.7 g/dL — AB (ref 6.5–8.1)

## 2016-06-21 MED ORDER — HYDROMORPHONE HCL 1 MG/ML IJ SOLN
1.0000 mg | Freq: Once | INTRAMUSCULAR | Status: DC
Start: 2016-06-21 — End: 2016-06-21

## 2016-06-21 NOTE — ED Notes (Signed)
We find her to be very ataxic, even with two-assist. She is, however, able to bear her weight with balancing assistance.

## 2016-06-21 NOTE — ED Triage Notes (Signed)
Per EMS, pt from [facility], reports unwitnessed fall.  Pt has hx of alzheimer's.  Denies any complaints at this time.

## 2016-06-21 NOTE — ED Notes (Signed)
She moves all extremities at will.  She is markedly confused; which staff at [facility] told EMS is her baseline.

## 2016-06-21 NOTE — ED Provider Notes (Signed)
[facility]-EMERGENCY DEPT Provider Note   CSN: [id number] Arrival date & time: 06/21/16  8563  First Provider Contact:  First MD Initiated Contact with Patient 06/21/16 1036        History   Chief Complaint Chief Complaint  Patient presents with  . Fall    HPI Tanya Rivas is a 65 y.o. female. Tanya Rivas is a 65 y.o. female who presents to the Emergency Department complaining of fall.  Level V caveat due to dementia.  Hx is provided by [facility] staff.  They report she had an unwitnessed fall in her room today.  Pt denies any complaints in the ED.   Per NH she has been less active for the last week - no longer ambulating or feeding herself.  No fevers or vomiting.    The history is provided by the patient and the nursing home. No language interpreter was used.  Fall     Past Medical History:  Diagnosis Date  . Alzheimer disease   . COPD (chronic obstructive pulmonary disease) (HCC)   . Hepatitis C carrier   . Hypertension   . Seizures [facility])     Patient Active Problem List   Diagnosis Date Noted  . Seizure-like activity (HCC) 06/06/2016  . Dementia 06/06/2016    Past Surgical History:  Procedure Laterality Date  . ABDOMINAL HYSTERECTOMY      OB History    No data available       Home Medications    Prior to Admission medications   Medication Sig Start Date End Date Taking? Authorizing Provider  acetaminophen (TYLENOL) 500 MG tablet Take 500 mg by mouth every 4 (four) hours as needed for mild pain, fever or headache.   Yes Historical Provider, MD  acetaminophen (TYLENOL) 500 MG tablet Take 500 mg by mouth 2 (two) times daily. Scheduled   Yes Historical Provider, MD  alum & mag hydroxide-simeth (MINTOX) 200-200-20 MG/5ML suspension Take 30 mLs by mouth as needed for indigestion or heartburn.   Yes Historical Provider, MD  amLODipine (NORVASC) 10 MG tablet Take 10 mg by mouth daily.   Yes Historical Provider, MD  aspirin 81 MG chewable tablet Chew 81 mg by mouth  daily.   Yes Historical Provider, MD  benazepril (LOTENSIN) 10 MG tablet Take 10 mg by mouth daily.   Yes Historical Provider, MD  cholecalciferol (VITAMIN D) 1000 UNITS tablet Take 1,000 Units by mouth daily.   Yes Historical Provider, MD  divalproex (DEPAKOTE) 500 MG DR tablet Take 2 tablets (1,000 mg total) by mouth 2 (two) times daily. 06/06/16  Yes Levert Feinstein, MD  Fluticasone-Salmeterol (ADVAIR) 250-50 MCG/DOSE AEPB Inhale 1 puff into the lungs 2 (two) times daily.   Yes Historical Provider, MD  furosemide (LASIX) 20 MG tablet Take 20 mg by mouth daily.   Yes Historical Provider, MD  guaifenesin (ROBAFEN) 100 MG/5ML syrup Take 200 mg by mouth every 6 (six) hours as needed for cough.   Yes Historical Provider, MD  isosorbide mononitrate (IMDUR) 60 MG 24 hr tablet Take 60 mg by mouth daily.   Yes Historical Provider, MD  loperamide (IMODIUM) 2 MG capsule Take 2 mg by mouth as needed for diarrhea or loose stools.   Yes Historical Provider, MD  LORazepam (ATIVAN) 0.5 MG tablet Take 0.5 mg by mouth every 8 (eight) hours as needed (agitation).    Yes Historical Provider, MD  LORazepam (ATIVAN) 0.5 MG tablet Take 0.5 mg by mouth 2 (two) times daily.  Yes Historical Provider, MD  magnesium hydroxide (MILK OF MAGNESIA) 400 MG/5ML suspension Take 30 mLs by mouth at bedtime as needed for mild constipation.   Yes Historical Provider, MD  Melatonin 3 MG TABS Take 1 tablet by mouth at bedtime.   Yes Historical Provider, MD  memantine (NAMENDA) 10 MG tablet Take 1 tablet (10 mg total) by mouth 2 (two) times daily. 06/08/15  Yes Levert Feinstein, MD  metoprolol (LOPRESSOR) 50 MG tablet Take 50 mg by mouth 2 (two) times daily.   Yes Historical Provider, MD  Multiple Vitamin (DAILY VITE) TABS Take 1 tablet by mouth daily.   Yes Historical Provider, MD  neomycin-bacitracin-polymyxin (NEOSPORIN) [phone] ointment Apply 1 application topically daily as needed (skin tears or abrasions.).    Yes Historical Provider, MD    PARoxetine (PAXIL) 10 MG tablet Take 15 mg by mouth at bedtime.    Yes Historical Provider, MD  QUEtiapine (SEROQUEL) 50 MG tablet Take 50 mg by mouth 2 (two) times daily.   Yes Historical Provider, MD  ranolazine (RANEXA) 1000 MG SR tablet Take 1,000 mg by mouth 2 (two) times daily.   Yes Historical Provider, MD  thiamine (VITAMIN B-1) 100 MG tablet Take 100 mg by mouth daily.   Yes Historical Provider, MD  tiotropium (SPIRIVA) 18 MCG inhalation capsule Place 18 mcg into inhaler and inhale daily.   Yes Historical Provider, MD  traZODone (DESYREL) 50 MG tablet Take 50 mg by mouth at bedtime.   Yes Historical Provider, MD  VENTOLIN HFA 108 (90 BASE) MCG/ACT inhaler Inhale 1 puff into the lungs every 4 (four) hours as needed for shortness of breath.  03/29/15  Yes Historical Provider, MD  levETIRAcetam (KEPPRA) 750 MG tablet One in am and 2 tabs po qhs Patient not taking: Reported on 06/15/2016 06/08/15   Levert Feinstein, MD    Family History Family History  Problem Relation Age of Onset  . Family history unknown: Yes    Social History Social History  Substance Use Topics  . Smoking status: Never Smoker  . Smokeless tobacco: Never Used  . Alcohol use No     Allergies   Review of patient's allergies indicates no known allergies.   Review of Systems Review of Systems  All other systems reviewed and are negative.    Physical Exam Updated Vital Signs BP 149/81 (BP Location: Left Arm)   Pulse 60   Temp 98 F (36.7 C) (Oral)   Resp 15   SpO2 99%   Physical Exam  Constitutional: She appears well-developed and well-nourished.  HENT:  Head: Normocephalic and atraumatic.  Cardiovascular: Normal rate and regular rhythm.   No murmur heard. Pulmonary/Chest: Effort normal and breath sounds normal. No respiratory distress.  Abdominal: Soft. There is no tenderness. There is no rebound and no guarding.  Musculoskeletal: She exhibits no edema or tenderness.  Neurological: She is alert.   Disoriented to place and time.  Generalized weakness.   Skin: Skin is warm and dry.  Psychiatric: She has a normal mood and affect. Her behavior is normal.  Nursing note and vitals reviewed.    ED Treatments / Results  Labs (all labs ordered are listed, but only abnormal results are displayed) Labs Reviewed  COMPREHENSIVE METABOLIC PANEL - Abnormal; Notable for the following:       Result Value   Creatinine, Ser 1.29 (*)    Total Protein 8.7 (*)    GFR calc non Af Amer 42 (*)    GFR calc  Af Amer 49 (*)    All other components within normal limits  CBC WITH DIFFERENTIAL/PLATELET - Abnormal; Notable for the following:    Platelets 149 (*)    All other components within normal limits  URINALYSIS, ROUTINE W REFLEX MICROSCOPIC (NOT AT [facility]) - Abnormal; Notable for the following:    Ketones, ur 15 (*)    All other components within normal limits  URINE CULTURE    EKG  EKG Interpretation None       Radiology Dg Chest 2 View  Result Date: 06/21/2016 CLINICAL DATA:  Unwitnessed fall, history of Alzheimer's disease, COPD. EXAM: CHEST  2 VIEW COMPARISON:  PA and lateral chest x-ray of March 21, 2015 FINDINGS: The lungs are adequately inflated. There is no focal infiltrate. There is no pleural effusion. The heart and pulmonary vascularity are normal. The mediastinum is normal in width. The observed portions of the bony thorax exhibit no acute abnormalities. There is mild multilevel degenerative disc disease of the thoracic spine. IMPRESSION: There is no active cardiopulmonary disease. No acute post traumatic injury is observed. Electronically Signed   By: David  [place] M.D.   On: 06/21/2016 11:52  Ct Head Wo Contrast  Result Date: 06/21/2016 CLINICAL DATA:  Fall with trauma to the head and neck. EXAM: CT HEAD WITHOUT CONTRAST CT CERVICAL SPINE WITHOUT CONTRAST TECHNIQUE: Multidetector CT imaging of the head and cervical spine was performed following the standard protocol without  intravenous contrast. Multiplanar CT image reconstructions of the cervical spine were also generated. COMPARISON:  Similar examinations 5 days ago. FINDINGS: CT HEAD FINDINGS The brain shows generalized atrophy. There are mild chronic small-vessel ischemic changes of the deep white matter. There is ventriculomegaly secondary to central atrophy which is stable. No evidence of acute infarction, mass lesion, hemorrhage, hydrocephalus or extra-axial collection. No skull fracture. No fluid in the sinuses. 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 There is atherosclerotic calcification of the major vessels at the base of the brain. CT CERVICAL SPINE FINDINGS No traumatic malalignment. No fracture. No soft tissue swelling. Chronic facet arthropathy on the right worse at C3-4 than at C2-3 and C4-5. Chronic facet arthropathy on the left at C4-5. Chronic spondylosis with disc space narrowing at C5-6. Benign cysts of the C6 vertebral body. IMPRESSION: Head CT: No acute or traumatic finding. Atrophy and chronic small vessel disease. Cervical spine CT: Chronic degenerative spondylosis and facet arthropathy. No acute or traumatic finding. Electronically Signed   By: Paulina Fusi M.D.   On: 06/21/2016 11:48  Ct Cervical Spine Wo Contrast  Result Date: 06/21/2016 CLINICAL DATA:  Fall with trauma to the head and neck. EXAM: CT HEAD WITHOUT CONTRAST CT CERVICAL SPINE WITHOUT CONTRAST TECHNIQUE: Multidetector CT imaging of the head and cervical spine was performed following the standard protocol without intravenous contrast. Multiplanar CT image reconstructions of the cervical spine were also generated. COMPARISON:  Similar examinations 5 days ago. FINDINGS: CT HEAD FINDINGS The brain shows generalized atrophy. There are mild chronic small-vessel ischemic changes of the deep white matter. There is ventriculomegaly secondary to central atrophy which is stable. No evidence of acute infarction, mass lesion, hemorrhage,  hydrocephalus or extra-axial collection. No skull fracture. No fluid in the sinuses. 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 There is atherosclerotic calcification of the major vessels at the base of the brain. CT CERVICAL SPINE FINDINGS No traumatic malalignment. No fracture. No soft tissue swelling. Chronic facet arthropathy on the right worse at C3-4 than  at C2-3 and C4-5. Chronic facet arthropathy on the left at C4-5. Chronic spondylosis with disc space narrowing at C5-6. Benign cysts of the C6 vertebral body. IMPRESSION: Head CT: No acute or traumatic finding. Atrophy and chronic small vessel disease. Cervical spine CT: Chronic degenerative spondylosis and facet arthropathy. No acute or traumatic finding. Electronically Signed   By: Paulina Fusi M.D.   On: 06/21/2016 11:48   Procedures Procedures (including critical care time)  Medications Ordered in ED Medications - No data to display   Initial Impression / Assessment and Plan / ED Course  I have reviewed the triage vital signs and the nursing notes.  Pertinent labs & imaging results that were available during my care of the patient were reviewed by me and considered in my medical decision making (see chart for details).  Clinical Course    Patient here for evaluation of injuries following an unwitnessed fall. There is no evidence of injury on examination. Patient does have an significantly ataxic gait but she has been nonambulatory for at least a week and her baseline ambulatory status was minimal. There is no evidence of acute injury, acute infectious process. Presentation is not consistent with acute CVA. Plan to DC back to facility with close outpatient follow-up and return precautions.  Final Clinical Impressions(s) / ED Diagnoses   Final diagnoses:  Fall, initial encounter    New Prescriptions Discharge Medication List as of 06/21/2016  3:10 PM       Tilden Fossa, MD 06/22/16 1118

## 2016-06-22 ENCOUNTER — Emergency Department (HOSPITAL_COMMUNITY): Payer: Medicare Other

## 2016-06-22 ENCOUNTER — Encounter (HOSPITAL_COMMUNITY): Payer: Self-pay | Admitting: Emergency Medicine

## 2016-06-22 ENCOUNTER — Emergency Department (HOSPITAL_COMMUNITY)
Admission: EM | Admit: 2016-06-22 | Discharge: 2016-06-23 | Disposition: A | Discharge status: Home / Self Care | Payer: Medicare Other | Attending: Emergency Medicine | Admitting: Emergency Medicine

## 2016-06-22 DIAGNOSIS — I10 Essential (primary) hypertension: Secondary | ICD-10-CM | POA: Insufficient documentation

## 2016-06-22 DIAGNOSIS — J449 Chronic obstructive pulmonary disease, unspecified: Secondary | ICD-10-CM | POA: Diagnosis not present

## 2016-06-22 DIAGNOSIS — Z7982 Long term (current) use of aspirin: Secondary | ICD-10-CM | POA: Diagnosis not present

## 2016-06-22 DIAGNOSIS — G309 Alzheimer's disease, unspecified: Secondary | ICD-10-CM | POA: Diagnosis not present

## 2016-06-22 DIAGNOSIS — Y999 Unspecified external cause status: Secondary | ICD-10-CM | POA: Insufficient documentation

## 2016-06-22 DIAGNOSIS — Z048 Encounter for examination and observation for other specified reasons: Secondary | ICD-10-CM | POA: Diagnosis not present

## 2016-06-22 DIAGNOSIS — Y939 Activity, unspecified: Secondary | ICD-10-CM | POA: Insufficient documentation

## 2016-06-22 DIAGNOSIS — Z79899 Other long term (current) drug therapy: Secondary | ICD-10-CM | POA: Insufficient documentation

## 2016-06-22 DIAGNOSIS — Y929 Unspecified place or not applicable: Secondary | ICD-10-CM | POA: Insufficient documentation

## 2016-06-22 DIAGNOSIS — W19XXXA Unspecified fall, initial encounter: Secondary | ICD-10-CM | POA: Insufficient documentation

## 2016-06-22 DIAGNOSIS — F0391 Unspecified dementia with behavioral disturbance: Secondary | ICD-10-CM | POA: Diagnosis not present

## 2016-06-22 LAB — CBG MONITORING, ED: GLUCOSE-CAPILLARY: 76 mg/dL (ref 65–99)

## 2016-06-22 NOTE — ED Provider Notes (Signed)
[facility]-EMERGENCY DEPT Provider Note   CSN: [id number] Arrival date & time: 06/22/16  2122  By signing my name below, I, Tanya Rivas, attest that this documentation has been prepared under the direction and in the presence of [employer], PA-C. Electronically Signed: Alyssa Rivas, ED Scribe. 06/22/16. 1:55 AM.  First Provider Contact:  10:16 PM   History   Chief Complaint Chief Complaint  Patient presents with  . Fall   LEVEL 5 CAVEAT DUE TO DEMENTIA   The history is provided by the patient. No language interpreter was used.   HPI Comments: Tanya Rivas is a 65 y.o. female with PMHx of Alzheimer disease, COPD, and HTN  who presents to the Emergency Department by EMS complaining of a fall.   Past Medical History:  Diagnosis Date  . Alzheimer disease   . COPD (chronic obstructive pulmonary disease) (HCC)   . Hepatitis C carrier   . Hypertension   . Seizures [facility]     Patient Active Problem List   Diagnosis Date Noted  . Seizure-like activity (HCC) 06/06/2016  . Dementia 06/06/2016    Past Surgical History:  Procedure Laterality Date  . ABDOMINAL HYSTERECTOMY      OB History    No data available       Home Medications    Prior to Admission medications   Medication Sig Start Date End Date Taking? Authorizing Provider  acetaminophen (TYLENOL) 500 MG tablet Take 500 mg by mouth 2 (two) times daily.    Yes Historical Provider, MD  acetaminophen (TYLENOL) 500 MG tablet Take 500 mg by mouth 2 (two) times daily. Scheduled   Yes Historical Provider, MD  alum & mag hydroxide-simeth (MINTOX) 200-200-20 MG/5ML suspension Take 30 mLs by mouth as needed for indigestion or heartburn.   Yes Historical Provider, MD  amLODipine (NORVASC) 10 MG tablet Take 10 mg by mouth daily.   Yes Historical Provider, MD  aspirin 81 MG chewable tablet Chew 81 mg by mouth daily.   Yes Historical Provider, MD  benazepril (LOTENSIN) 10 MG tablet Take 10 mg by mouth daily.   Yes Historical  Provider, MD  cholecalciferol (VITAMIN D) 1000 UNITS tablet Take 1,000 Units by mouth daily.   Yes Historical Provider, MD  divalproex (DEPAKOTE) 500 MG DR tablet Take 2 tablets (1,000 mg total) by mouth 2 (two) times daily. 06/06/16  Yes Levert Feinstein, MD  Fluticasone-Salmeterol (ADVAIR) 250-50 MCG/DOSE AEPB Inhale 1 puff into the lungs 2 (two) times daily.   Yes Historical Provider, MD  furosemide (LASIX) 20 MG tablet Take 20 mg by mouth daily.   Yes Historical Provider, MD  guaifenesin (ROBAFEN) 100 MG/5ML syrup Take 200 mg by mouth every 6 (six) hours as needed for cough.   Yes Historical Provider, MD  isosorbide mononitrate (IMDUR) 60 MG 24 hr tablet Take 60 mg by mouth daily.   Yes Historical Provider, MD  loperamide (IMODIUM) 2 MG capsule Take 2 mg by mouth as needed for diarrhea or loose stools.   Yes Historical Provider, MD  LORazepam (ATIVAN) 0.5 MG tablet Take 0.5 mg by mouth every 8 (eight) hours as needed (agitation).    Yes Historical Provider, MD  LORazepam (ATIVAN) 0.5 MG tablet Take 0.5 mg by mouth 2 (two) times daily.    Yes Historical Provider, MD  Melatonin 3 MG TABS Take 1 tablet by mouth at bedtime.   Yes Historical Provider, MD  metoprolol (LOPRESSOR) 50 MG tablet Take 50 mg by mouth 2 (two) times daily.  Yes Historical Provider, MD  Multiple Vitamin (DAILY VITE) TABS Take 1 tablet by mouth daily.   Yes Historical Provider, MD  neomycin-bacitracin-polymyxin (NEOSPORIN) [phone] ointment Apply 1 application topically daily as needed (skin tears or abrasions.).    Yes Historical Provider, MD  QUEtiapine (SEROQUEL) 50 MG tablet Take 50 mg by mouth 2 (two) times daily.   Yes Historical Provider, MD  ranolazine (RANEXA) 1000 MG SR tablet Take 1,000 mg by mouth 2 (two) times daily.   Yes Historical Provider, MD  thiamine (VITAMIN B-1) 100 MG tablet Take 100 mg by mouth daily.   Yes Historical Provider, MD  tiotropium (SPIRIVA) 18 MCG inhalation capsule Place 18 mcg into inhaler and  inhale daily.   Yes Historical Provider, MD  traZODone (DESYREL) 50 MG tablet Take 50 mg by mouth at bedtime.   Yes Historical Provider, MD  VENTOLIN HFA 108 (90 BASE) MCG/ACT inhaler Inhale 1 puff into the lungs every 4 (four) hours as needed for shortness of breath.  03/29/15  Yes Historical Provider, MD  levETIRAcetam (KEPPRA) 750 MG tablet One in am and 2 tabs po qhs Patient not taking: Reported on 06/15/2016 06/08/15   Levert Feinstein, MD  magnesium hydroxide (MILK OF MAGNESIA) 400 MG/5ML suspension Take 30 mLs by mouth at bedtime as needed for mild constipation.    Historical Provider, MD  memantine (NAMENDA) 10 MG tablet Take 1 tablet (10 mg total) by mouth 2 (two) times daily. Patient not taking: Reported on 06/22/2016 06/08/15   Levert Feinstein, MD    Family History Family History  Problem Relation Age of Onset  . Family history unknown: Yes    Social History Social History  Substance Use Topics  . Smoking status: Never Smoker  . Smokeless tobacco: Never Used  . Alcohol use No     Allergies   Review of patient's allergies indicates no known allergies.   Review of Systems Review of Systems  Unable to perform ROS: Dementia     Physical Exam Updated Vital Signs BP 124/76 (BP Location: Left Arm)   Pulse (!) 55   Temp 97.9 F (36.6 C) (Axillary)   Resp 14   SpO2 100%   Physical Exam  Constitutional: She appears well-developed and well-nourished. No distress.  Nontoxic appearing and in no distress  HENT:  Head: Normocephalic and atraumatic.  No Battle sign or raccoons eyes, hematoma, contusion, or skull instability.  Eyes: Conjunctivae and EOM are normal. No scleral icterus.  Neck: Normal range of motion.  No bony deformities, step-offs, or crepitus to the cervical midline.  Cardiovascular: Normal rate, regular rhythm and intact distal pulses.   Pulmonary/Chest: Effort normal. No respiratory distress. She has no wheezes. She has no rales.  Respirations even and unlabored.  Lungs clear to auscultation.  Abdominal: Soft. She exhibits no distension.  Musculoskeletal: Normal range of motion.  No leg shortening or malrotation  Neurological: She is alert.  Patient alert and moving all extremities.  Skin: Skin is warm and dry. No rash noted. She is not diaphoretic. No erythema. No pallor.  Psychiatric: She has a normal mood and affect. Her behavior is normal.  Nursing note and vitals reviewed.    ED Treatments / Results  DIAGNOSTIC STUDIES: Oxygen Saturation is 98% on 2L/min, normal by my interpretation.    Labs (all labs ordered are listed, but only abnormal results are displayed) Labs Reviewed  URINALYSIS, ROUTINE W REFLEX MICROSCOPIC (NOT AT [facility]) - Abnormal; Notable for the following:  Result Value   Color, Urine RED (*)    Specific Gravity, Urine 1.033 (*)    Bilirubin Urine MODERATE (*)    Ketones, ur 15 (*)    Protein, ur 30 (*)    Nitrite POSITIVE (*)    Leukocytes, UA SMALL (*)    All other components within normal limits  URINE MICROSCOPIC-ADD ON - Abnormal; Notable for the following:    Squamous Epithelial / LPF 0-5 (*)    Bacteria, UA RARE (*)    Casts HYALINE CASTS (*)    All other components within normal limits  I-STAT CHEM 8, ED - Abnormal; Notable for the following:    BUN 40 (*)    Creatinine, Ser 1.50 (*)    Hemoglobin 15.3 (*)    All other components within normal limits  URINE CULTURE  I-STAT TROPOININ, ED  CBG MONITORING, ED    ED ECG REPORT   Date: 06/23/2016  Rate: 53  Rhythm: sinus bradycardia  QRS Axis: normal  Intervals: normal QTc  ST/T Wave abnormalities: nonspecific T wave changes  Conduction Disutrbances:none  Narrative Interpretation: No STEMI or acute ischemic change  Old EKG Reviewed: none available  I have personally reviewed the EKG tracing and agree with the computerized printout as noted.   Radiology Dg Chest 1 View  Result Date: 06/22/2016 CLINICAL DATA:  Recent fall EXAM: CHEST 1 VIEW  COMPARISON:  06/21/2016 FINDINGS: The heart size and mediastinal contours are within normal limits. Both lungs are clear. The visualized skeletal structures are unremarkable. IMPRESSION: No active disease. Electronically Signed   By: Alcide Clever M.D.   On: 06/22/2016 22:54  Dg Chest 2 View  Result Date: 06/21/2016 CLINICAL DATA:  Unwitnessed fall, history of Alzheimer's disease, COPD. EXAM: CHEST  2 VIEW COMPARISON:  PA and lateral chest x-ray of March 21, 2015 FINDINGS: The lungs are adequately inflated. There is no focal infiltrate. There is no pleural effusion. The heart and pulmonary vascularity are normal. The mediastinum is normal in width. The observed portions of the bony thorax exhibit no acute abnormalities. There is mild multilevel degenerative disc disease of the thoracic spine. IMPRESSION: There is no active cardiopulmonary disease. No acute post traumatic injury is observed. Electronically Signed   By: David  [place] M.D.   On: 06/21/2016 11:52  Dg Pelvis 1-2 Views  Result Date: 06/22/2016 CLINICAL DATA:  Recent fall with pelvic pain, initial encounter EXAM: PELVIS - 1-2 VIEW COMPARISON:  None. FINDINGS: Pelvic ring is intact. No acute fracture or dislocation is noted. Diffuse vascular calcifications are seen. No soft tissue abnormality is noted. IMPRESSION: No acute abnormality noted. Electronically Signed   By: Alcide Clever M.D.   On: 06/22/2016 22:52  Ct Head Wo Contrast  Result Date: 06/22/2016 CLINICAL DATA:  65 year old female with fall EXAM: CT HEAD WITHOUT CONTRAST CT CERVICAL SPINE WITHOUT CONTRAST TECHNIQUE: Multidetector CT imaging of the head and cervical spine was performed following the standard protocol without intravenous contrast. Multiplanar CT image reconstructions of the cervical spine were also generated. COMPARISON:  CT dated 06/21/2016 FINDINGS: CT HEAD FINDINGS There is moderate age-related atrophy and chronic microvascular ischemic changes. There is no acute  intracranial hemorrhage. No mass effect or midline shift noted. The visualized paranasal sinuses and mastoid air cells are clear. The calvarium is intact. CT CERVICAL SPINE FINDINGS There is no acute fracture or subluxation of the cervical spine.There is osteopenia with multilevel degenerative changes of the spine. There is straightening of normal cervical lordosis. There is  disc space narrowing and endplate irregularity most prominent at C5-C6.The odontoid and spinous processes are intact.There is normal anatomic alignment of the C1-C2 lateral masses. The visualized soft tissues appear unremarkable. IMPRESSION: No acute intracranial hemorrhage. No acute/traumatic cervical spine pathology. Electronically Signed   By: Elgie Collard M.D.   On: 06/22/2016 23:17  Ct Head Wo Contrast  Result Date: 06/21/2016 CLINICAL DATA:  Fall with trauma to the head and neck. EXAM: CT HEAD WITHOUT CONTRAST CT CERVICAL SPINE WITHOUT CONTRAST TECHNIQUE: Multidetector CT imaging of the head and cervical spine was performed following the standard protocol without intravenous contrast. Multiplanar CT image reconstructions of the cervical spine were also generated. COMPARISON:  Similar examinations 5 days ago. FINDINGS: CT HEAD FINDINGS The brain shows generalized atrophy. There are mild chronic small-vessel ischemic changes of the deep white matter. There is ventriculomegaly secondary to central atrophy which is stable. No evidence of acute infarction, mass lesion, hemorrhage, hydrocephalus or extra-axial collection. No skull fracture. No fluid in the sinuses. 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 There is atherosclerotic calcification of the major vessels at the base of the brain. CT CERVICAL SPINE FINDINGS No traumatic malalignment. No fracture. No soft tissue swelling. Chronic facet arthropathy on the right worse at C3-4 than at C2-3 and C4-5. Chronic facet arthropathy on the left at C4-5. Chronic spondylosis  with disc space narrowing at C5-6. Benign cysts of the C6 vertebral body. IMPRESSION: Head CT: No acute or traumatic finding. Atrophy and chronic small vessel disease. Cervical spine CT: Chronic degenerative spondylosis and facet arthropathy. No acute or traumatic finding. Electronically Signed   By: Paulina Fusi M.D.   On: 06/21/2016 11:48  Ct Cervical Spine Wo Contrast  Result Date: 06/22/2016 CLINICAL DATA:  65 year old female with fall EXAM: CT HEAD WITHOUT CONTRAST CT CERVICAL SPINE WITHOUT CONTRAST TECHNIQUE: Multidetector CT imaging of the head and cervical spine was performed following the standard protocol without intravenous contrast. Multiplanar CT image reconstructions of the cervical spine were also generated. COMPARISON:  CT dated 06/21/2016 FINDINGS: CT HEAD FINDINGS There is moderate age-related atrophy and chronic microvascular ischemic changes. There is no acute intracranial hemorrhage. No mass effect or midline shift noted. The visualized paranasal sinuses and mastoid air cells are clear. The calvarium is intact. CT CERVICAL SPINE FINDINGS There is no acute fracture or subluxation of the cervical spine.There is osteopenia with multilevel degenerative changes of the spine. There is straightening of normal cervical lordosis. There is disc space narrowing and endplate irregularity most prominent at C5-C6.The odontoid and spinous processes are intact.There is normal anatomic alignment of the C1-C2 lateral masses. The visualized soft tissues appear unremarkable. IMPRESSION: No acute intracranial hemorrhage. No acute/traumatic cervical spine pathology. Electronically Signed   By: Elgie Collard M.D.   On: 06/22/2016 23:17  Ct Cervical Spine Wo Contrast  Result Date: 06/21/2016 CLINICAL DATA:  Fall with trauma to the head and neck. EXAM: CT HEAD WITHOUT CONTRAST CT CERVICAL SPINE WITHOUT CONTRAST TECHNIQUE: Multidetector CT imaging of the head and cervical spine was performed following the  standard protocol without intravenous contrast. Multiplanar CT image reconstructions of the cervical spine were also generated. COMPARISON:  Similar examinations 5 days ago. FINDINGS: CT HEAD FINDINGS The brain shows generalized atrophy. There are mild chronic small-vessel ischemic changes of the deep white matter. There is ventriculomegaly secondary to central atrophy which is stable. No evidence of acute infarction, mass lesion, hemorrhage, hydrocephalus or extra-axial collection. No skull fracture. No fluid in the sinuses.  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 There is atherosclerotic calcification of the major vessels at the base of the brain. CT CERVICAL SPINE FINDINGS No traumatic malalignment. No fracture. No soft tissue swelling. Chronic facet arthropathy on the right worse at C3-4 than at C2-3 and C4-5. Chronic facet arthropathy on the left at C4-5. Chronic spondylosis with disc space narrowing at C5-6. Benign cysts of the C6 vertebral body. IMPRESSION: Head CT: No acute or traumatic finding. Atrophy and chronic small vessel disease. Cervical spine CT: Chronic degenerative spondylosis and facet arthropathy. No acute or traumatic finding. Electronically Signed   By: Paulina Fusi M.D.   On: 06/21/2016 11:48   Procedures Procedures (including critical care time)  Medications Ordered in ED Medications - No data to display   Initial Impression / Assessment and Plan / ED Course  I have reviewed the triage vital signs and the nursing notes.  Pertinent labs & imaging results that were available during my care of the patient were reviewed by me and considered in my medical decision making (see chart for details).  Clinical Course    2:02 AM Patient monitored and taken OFF O2 via [place]. I stood at beside for at least 5 minutes. SpO2 remained 95-99% on RA while sleeping. HR 55-86bpm. Patient in no distress. Imaging negative. UA with positive nitrites, but suspect false positive given  urine color. There were found to be 0-5 RBC and WBC and rare bacteria. Urine sent for culture.   Patient stable for discharge back to her nursing facility. No indication for further emergent workup at this time. Will transfer via [facility]   Final Clinical Impressions(s) / ED Diagnoses   Final diagnoses:  Fall    New Prescriptions New Prescriptions   No medications on file    I personally performed the services described in this documentation, which was scribed in my presence. The recorded information has been reviewed and is accurate.       Antony Madura, PA-C 06/23/16 [id number]    Arby Barrette, MD 06/29/16 2329

## 2016-06-22 NOTE — Progress Notes (Signed)
Patient noted to have been seen in the ED 9 times within the last six months. Patient is from [facility] Patient here today for fall. Patient seen at [facility] ED yesterday for fall. 07/20 seen for seizure like activity Seen 06/21 for fall. Patient listed as having Medicare/Medicaid insurance Pcp Dr. Orson Slick [facility] called [facility] and spoke to [place]. Deanna Artis reports patient fell out of her bed this time. She reports patient usually takes her medications without difficulty but tonight it was more difficult to give them to her. She reports patient is usually able to walk around without using a walker, but have had to keep her in a wheelchair lately. She reports they discontinued two of her medications today hoping this would help her symptoms, did not specify which medications. Patient is from [facility] memory care. Deanna Artis reports patient does not have any family except one contact listed as a Armed forces logistics/support/administrative officer. Patient was seen at [facility] on 07/11 for seizures. No further EDCM needs at this time.

## 2016-06-22 NOTE — ED Notes (Signed)
Bed: [facility] Expected date:  Expected time:  Means of arrival:  Comments: EMS 65 yo female from [place]

## 2016-06-22 NOTE — ED Triage Notes (Signed)
Pt comes to ed via ems, c/o had a recent fall at her facility. Pt fell out of her bed. Pt is alert x1 self.  Pt only knows her name. Hx of dementia and alz/ Pt just got out this hospital on Tuesday. V/s on arrival 85 cbg, 98 spo2 on 2 liters 02. bp 102/68, rr18,   No onset of tremors at baseline.    Hx of hep c, HTN, COPD, seizures  Pt comes [facility]

## 2016-06-23 ENCOUNTER — Encounter (HOSPITAL_COMMUNITY): Payer: Self-pay | Admitting: Emergency Medicine

## 2016-06-23 ENCOUNTER — Emergency Department (HOSPITAL_COMMUNITY)
Admission: EM | Admit: 2016-06-23 | Discharge: 2016-06-23 | Disposition: A | Discharge status: Skilled Nursing Facility | Payer: Medicare Other | Attending: Emergency Medicine | Admitting: Emergency Medicine

## 2016-06-23 DIAGNOSIS — Z7982 Long term (current) use of aspirin: Secondary | ICD-10-CM | POA: Diagnosis not present

## 2016-06-23 DIAGNOSIS — R410 Disorientation, unspecified: Secondary | ICD-10-CM

## 2016-06-23 DIAGNOSIS — N179 Acute kidney failure, unspecified: Secondary | ICD-10-CM | POA: Insufficient documentation

## 2016-06-23 DIAGNOSIS — Z79899 Other long term (current) drug therapy: Secondary | ICD-10-CM | POA: Diagnosis not present

## 2016-06-23 DIAGNOSIS — I10 Essential (primary) hypertension: Secondary | ICD-10-CM | POA: Diagnosis not present

## 2016-06-23 DIAGNOSIS — Z048 Encounter for examination and observation for other specified reasons: Secondary | ICD-10-CM | POA: Diagnosis not present

## 2016-06-23 DIAGNOSIS — F0391 Unspecified dementia with behavioral disturbance: Secondary | ICD-10-CM | POA: Insufficient documentation

## 2016-06-23 DIAGNOSIS — R296 Repeated falls: Secondary | ICD-10-CM | POA: Diagnosis not present

## 2016-06-23 DIAGNOSIS — J449 Chronic obstructive pulmonary disease, unspecified: Secondary | ICD-10-CM | POA: Insufficient documentation

## 2016-06-23 LAB — CBC WITH DIFFERENTIAL/PLATELET
Basophils Absolute: 0 10*3/uL (ref 0.0–0.1)
Basophils Relative: 0 %
Eosinophils Absolute: 0 10*3/uL (ref 0.0–0.7)
Eosinophils Relative: 0 %
HCT: 41.6 % (ref 36.0–46.0)
Hemoglobin: 13.2 g/dL (ref 12.0–15.0)
Lymphocytes Relative: 30 %
Lymphs Abs: 1.7 10*3/uL (ref 0.7–4.0)
MCH: 27.7 pg (ref 26.0–34.0)
MCHC: 31.7 g/dL (ref 30.0–36.0)
MCV: 87.2 fL (ref 78.0–100.0)
Monocytes Absolute: 0.3 10*3/uL (ref 0.1–1.0)
Monocytes Relative: 5 %
Neutro Abs: 3.8 10*3/uL (ref 1.7–7.7)
Neutrophils Relative %: 65 %
Platelets: 146 10*3/uL — ABNORMAL LOW (ref 150–400)
RBC: 4.77 MIL/uL (ref 3.87–5.11)
RDW: 14.7 % (ref 11.5–15.5)
WBC: 5.8 10*3/uL (ref 4.0–10.5)

## 2016-06-23 LAB — COMPREHENSIVE METABOLIC PANEL WITH GFR
ALT: 18 U/L (ref 14–54)
AST: 29 U/L (ref 15–41)
Albumin: 3.7 g/dL (ref 3.5–5.0)
Alkaline Phosphatase: 62 U/L (ref 38–126)
Anion gap: 9 (ref 5–15)
BUN: 36 mg/dL — ABNORMAL HIGH (ref 6–20)
CO2: 28 mmol/L (ref 22–32)
Calcium: 9.3 mg/dL (ref 8.9–10.3)
Chloride: 106 mmol/L (ref 101–111)
Creatinine, Ser: 2.26 mg/dL — ABNORMAL HIGH (ref 0.44–1.00)
GFR calc Af Amer: 25 mL/min — ABNORMAL LOW
GFR calc non Af Amer: 21 mL/min — ABNORMAL LOW
Glucose, Bld: 114 mg/dL — ABNORMAL HIGH (ref 65–99)
Potassium: 4.1 mmol/L (ref 3.5–5.1)
Sodium: 143 mmol/L (ref 135–145)
Total Bilirubin: 1 mg/dL (ref 0.3–1.2)
Total Protein: 8.9 g/dL — ABNORMAL HIGH (ref 6.5–8.1)

## 2016-06-23 LAB — URINE MICROSCOPIC-ADD ON

## 2016-06-23 LAB — URINALYSIS, ROUTINE W REFLEX MICROSCOPIC
Glucose, UA: NEGATIVE mg/dL
Hgb urine dipstick: NEGATIVE
Ketones, ur: 15 mg/dL — AB
NITRITE: POSITIVE — AB
PH: 5 (ref 5.0–8.0)
Protein, ur: 30 mg/dL — AB
SPECIFIC GRAVITY, URINE: 1.033 — AB (ref 1.005–1.030)

## 2016-06-23 LAB — VALPROIC ACID LEVEL: Valproic Acid Lvl: 61 ug/mL (ref 50.0–100.0)

## 2016-06-23 LAB — I-STAT CHEM 8, ED
BUN: 40 mg/dL — AB (ref 6–20)
CALCIUM ION: 1.13 mmol/L (ref 1.12–1.23)
CHLORIDE: 106 mmol/L (ref 101–111)
Creatinine, Ser: 1.5 mg/dL — ABNORMAL HIGH (ref 0.44–1.00)
Glucose, Bld: 79 mg/dL (ref 65–99)
HCT: 45 % (ref 36.0–46.0)
Hemoglobin: 15.3 g/dL — ABNORMAL HIGH (ref 12.0–15.0)
POTASSIUM: 3.8 mmol/L (ref 3.5–5.1)
SODIUM: 145 mmol/L (ref 135–145)
TCO2: 33 mmol/L (ref 0–100)

## 2016-06-23 LAB — URINE CULTURE: Culture: NO GROWTH

## 2016-06-23 LAB — I-STAT TROPONIN, ED: Troponin i, poc: 0.01 ng/mL (ref 0.00–0.08)

## 2016-06-23 LAB — AMMONIA: Ammonia: 12 umol/L (ref 9–35)

## 2016-06-23 MED ORDER — PAROXETINE HCL 10 MG PO TABS
10.0000 mg | ORAL_TABLET | Freq: Every day | ORAL | Status: DC
  Administered 2016-06-23: 10 mg via ORAL
  Filled 2016-06-23: qty 1

## 2016-06-23 MED ORDER — LORAZEPAM 0.5 MG PO TABS
0.5000 mg | ORAL_TABLET | Freq: Once | ORAL | Status: AC
  Administered 2016-06-23: 0.5 mg via ORAL
  Filled 2016-06-23: qty 1

## 2016-06-23 MED ORDER — FOSFOMYCIN TROMETHAMINE 3 G PO PACK
3.0000 g | PACK | Freq: Once | ORAL | Status: AC
  Administered 2016-06-23: 3 g via ORAL
  Filled 2016-06-23: qty 3

## 2016-06-23 MED ORDER — CEPHALEXIN 500 MG PO CAPS: 500.0000 mg | ORAL_CAPSULE | Freq: Once | ORAL | Status: DC

## 2016-06-23 MED ORDER — SODIUM CHLORIDE 0.9 % IV BOLUS (SEPSIS)
1000.0000 mL | Freq: Once | INTRAVENOUS | Status: AC
  Administered 2016-06-23: 1000 mL via INTRAVENOUS

## 2016-06-23 NOTE — ED Notes (Signed)
[facility] called 

## 2016-06-23 NOTE — ED Notes (Signed)
Patient agitated and moving while taking vital signs.

## 2016-06-23 NOTE — ED Triage Notes (Signed)
Patient from [facility] was fine at breakfast, then she went back to bed and staff was unable to wake her up fully to eat lunch.  [id number].  Patient has tremors so EMS was unable to obtain accurate vital signs or EKG.  Responsive to cold and pain, but mumbling incoherently.  History of dementia.  Seen here one week ago for same symptoms.

## 2016-06-23 NOTE — ED Notes (Signed)
Bed: [facility] Expected date:  Expected time:  Means of arrival:  Comments: 

## 2016-06-23 NOTE — ED Provider Notes (Signed)
[facility]-EMERGENCY DEPT Provider Note   CSN: [id number] Arrival date & time: 06/23/16  1415  First Provider Contact:  None       History   Chief Complaint Chief Complaint  Patient presents with  . Altered Mental Status    HPI Tanya Rivas is a 65 y.o. female.  HPI  At lunch time today, was sleepy, couldn't get her to open her eyes, was not responding, 1245PM, tried compress, chest rub and no change. Has not had prior episode like htat. Lasted 2-3 minutes. No seizure-like activity. Skin was warm and dry.  however when EMTs were taking her out of the door she was awake. Changed the seroquel to 50mg  in AM and 100 at night, but has not received increased dose. Paxil stopped last night. Exelon stopped as well yesterday.     Was in ED last night until middle of night. Had CT and labs for fall.  At her baseline today.  Has been having hallucinations--yesterday said saw a rat yesterday. Daughter reports she used to have hallucinations.   Several recent falls Hallucination like behavior Psychiatrist saw her yesterday and dcd paxil, exelon   Chronic cough, history of same. No new cough. No fevers.   Recent decline over last few weeks, declining ADLS, more falls, more hallucinations.  Now nearly nonverbal, saying things that don't make sense, confusion at baseline    History obtained from Tammy at [facility] Level V caveat dementia  Past Medical History:  Diagnosis Date  . Alzheimer disease   . COPD (chronic obstructive pulmonary disease) (HCC)   . Hepatitis C carrier   . Hypertension   . Seizures [facility])     Patient Active Problem List   Diagnosis Date Noted  . Seizure-like activity (HCC) 06/06/2016  . Dementia 06/06/2016    Past Surgical History:  Procedure Laterality Date  . ABDOMINAL HYSTERECTOMY      OB History    No data available       Home Medications    Prior to Admission medications   Medication Sig Start Date End Date Taking? Authorizing  Provider  acetaminophen (TYLENOL) 500 MG tablet Take 500 mg by mouth every 4 (four) hours as needed for mild pain, fever or headache.    Yes Historical Provider, MD  acetaminophen (TYLENOL) 500 MG tablet Take 500 mg by mouth 2 (two) times daily. Scheduled   Yes Historical Provider, MD  alum & mag hydroxide-simeth (MINTOX) 200-200-20 MG/5ML suspension Take 30 mLs by mouth as needed for indigestion or heartburn.   Yes Historical Provider, MD  amLODipine (NORVASC) 10 MG tablet Take 10 mg by mouth daily.   Yes Historical Provider, MD  aspirin 81 MG chewable tablet Chew 81 mg by mouth daily.   Yes Historical Provider, MD  benazepril (LOTENSIN) 10 MG tablet Take 10 mg by mouth daily.   Yes Historical Provider, MD  cholecalciferol (VITAMIN D) 1000 UNITS tablet Take 1,000 Units by mouth daily.   Yes Historical Provider, MD  divalproex (DEPAKOTE) 500 MG DR tablet Take 2 tablets (1,000 mg total) by mouth 2 (two) times daily. 06/06/16  Yes Levert Feinstein, MD  Fluticasone-Salmeterol (ADVAIR) 250-50 MCG/DOSE AEPB Inhale 1 puff into the lungs 2 (two) times daily.   Yes Historical Provider, MD  furosemide (LASIX) 20 MG tablet Take 20 mg by mouth daily.   Yes Historical Provider, MD  guaifenesin (ROBAFEN) 100 MG/5ML syrup Take 200 mg by mouth every 6 (six) hours as needed for cough.   Yes  Historical Provider, MD  isosorbide mononitrate (IMDUR) 60 MG 24 hr tablet Take 60 mg by mouth daily.   Yes Historical Provider, MD  loperamide (IMODIUM) 2 MG capsule Take 2 mg by mouth as needed for diarrhea or loose stools.   Yes Historical Provider, MD  LORazepam (ATIVAN) 0.5 MG tablet Take 0.5 mg by mouth every 8 (eight) hours as needed (agitation).    Yes Historical Provider, MD  LORazepam (ATIVAN) 0.5 MG tablet Take 0.5 mg by mouth 2 (two) times daily.    Yes Historical Provider, MD  magnesium hydroxide (MILK OF MAGNESIA) 400 MG/5ML suspension Take 30 mLs by mouth at bedtime as needed for mild constipation.   Yes Historical  Provider, MD  Melatonin 3 MG TABS Take 1 tablet by mouth at bedtime.   Yes Historical Provider, MD  metoprolol (LOPRESSOR) 50 MG tablet Take 50 mg by mouth 2 (two) times daily.   Yes Historical Provider, MD  Multiple Vitamin (DAILY VITE) TABS Take 1 tablet by mouth daily.   Yes Historical Provider, MD  neomycin-bacitracin-polymyxin (NEOSPORIN) [phone] ointment Apply 1 application topically daily as needed (skin tears or abrasions.).    Yes Historical Provider, MD  QUEtiapine (SEROQUEL) 50 MG tablet Take 50 mg by mouth daily.    Yes Historical Provider, MD  ranolazine (RANEXA) 1000 MG SR tablet Take 1,000 mg by mouth 2 (two) times daily.   Yes Historical Provider, MD  rivastigmine (EXELON) 1.5 MG capsule Take 1.5 mg by mouth 2 (two) times daily. For 14 days   Yes Historical Provider, MD  thiamine (VITAMIN B-1) 100 MG tablet Take 100 mg by mouth daily.   Yes Historical Provider, MD  tiotropium (SPIRIVA) 18 MCG inhalation capsule Place 18 mcg into inhaler and inhale daily.   Yes Historical Provider, MD  traZODone (DESYREL) 50 MG tablet Take 50 mg by mouth at bedtime.   Yes Historical Provider, MD  VENTOLIN HFA 108 (90 BASE) MCG/ACT inhaler Inhale 1 puff into the lungs every 4 (four) hours as needed for shortness of breath.  03/29/15  Yes Historical Provider, MD    Family History Family History  Problem Relation Age of Onset  . Family history unknown: Yes    Social History Social History  Substance Use Topics  . Smoking status: Never Smoker  . Smokeless tobacco: Never Used  . Alcohol use No     Allergies   Review of patient's allergies indicates no known allergies.   Review of Systems Review of Systems  Unable to perform ROS: Dementia     Physical Exam Updated Vital Signs BP 138/88   Pulse (!) 58   Temp 97.2 F (36.2 C) (Oral)   Resp 18   SpO2 91%   Physical Exam  Constitutional: She appears well-developed and well-nourished. No distress.  HENT:  Head: Normocephalic  and atraumatic.  Dry mucous membranes  Eyes: Conjunctivae and EOM are normal.  Neck: Normal range of motion.  Cardiovascular: Normal rate, regular rhythm, normal heart sounds and intact distal pulses.  Exam reveals no gallop and no friction rub.   No murmur heard. Pulmonary/Chest: Effort normal and breath sounds normal. No respiratory distress. She has no wheezes. She has no rales.  Abdominal: Soft. She exhibits no distension. There is no tenderness. There is no guarding.  Musculoskeletal: She exhibits no edema or tenderness.  Neurological: She is alert. She displays tremor. GCS eye subscore is 4. GCS verbal subscore is 3. GCS motor subscore is 6.  Eyes closed however arouses  to voice, agitated, does not answer questions Having tremor bilaterally however responds appropriately Intermittently follows commands, moving all 4 extremities equally, symmetric facies When asked to open eyes does so with attitude   Skin: Skin is warm and dry. No rash noted. She is not diaphoretic. No erythema.  Nursing note and vitals reviewed.    ED Treatments / Results  Labs (all labs ordered are listed, but only abnormal results are displayed) Labs Reviewed  CBC WITH DIFFERENTIAL/PLATELET - Abnormal; Notable for the following:       Result Value   Platelets 146 (*)    All other components within normal limits  COMPREHENSIVE METABOLIC PANEL - Abnormal; Notable for the following:    Glucose, Bld 114 (*)    BUN 36 (*)    Creatinine, Ser 2.26 (*)    Total Protein 8.9 (*)    GFR calc non Af Amer 21 (*)    GFR calc Af Amer 25 (*)    All other components within normal limits  VALPROIC ACID LEVEL  AMMONIA    EKG  EKG Interpretation None       Radiology Dg Chest 1 View  Result Date: 06/22/2016 CLINICAL DATA:  Recent fall EXAM: CHEST 1 VIEW COMPARISON:  06/21/2016 FINDINGS: The heart size and mediastinal contours are within normal limits. Both lungs are clear. The visualized skeletal structures are  unremarkable. IMPRESSION: No active disease. Electronically Signed   By: Alcide Clever M.D.   On: 06/22/2016 22:54  Dg Pelvis 1-2 Views  Result Date: 06/22/2016 CLINICAL DATA:  Recent fall with pelvic pain, initial encounter EXAM: PELVIS - 1-2 VIEW COMPARISON:  None. FINDINGS: Pelvic ring is intact. No acute fracture or dislocation is noted. Diffuse vascular calcifications are seen. No soft tissue abnormality is noted. IMPRESSION: No acute abnormality noted. Electronically Signed   By: Alcide Clever M.D.   On: 06/22/2016 22:52  Ct Head Wo Contrast  Result Date: 06/22/2016 CLINICAL DATA:  65 year old female with fall EXAM: CT HEAD WITHOUT CONTRAST CT CERVICAL SPINE WITHOUT CONTRAST TECHNIQUE: Multidetector CT imaging of the head and cervical spine was performed following the standard protocol without intravenous contrast. Multiplanar CT image reconstructions of the cervical spine were also generated. COMPARISON:  CT dated 06/21/2016 FINDINGS: CT HEAD FINDINGS There is moderate age-related atrophy and chronic microvascular ischemic changes. There is no acute intracranial hemorrhage. No mass effect or midline shift noted. The visualized paranasal sinuses and mastoid air cells are clear. The calvarium is intact. CT CERVICAL SPINE FINDINGS There is no acute fracture or subluxation of the cervical spine.There is osteopenia with multilevel degenerative changes of the spine. There is straightening of normal cervical lordosis. There is disc space narrowing and endplate irregularity most prominent at C5-C6.The odontoid and spinous processes are intact.There is normal anatomic alignment of the C1-C2 lateral masses. The visualized soft tissues appear unremarkable. IMPRESSION: No acute intracranial hemorrhage. No acute/traumatic cervical spine pathology. Electronically Signed   By: Elgie Collard M.D.   On: 06/22/2016 23:17  Ct Cervical Spine Wo Contrast  Result Date: 06/22/2016 CLINICAL DATA:  65 year old female  with fall EXAM: CT HEAD WITHOUT CONTRAST CT CERVICAL SPINE WITHOUT CONTRAST TECHNIQUE: Multidetector CT imaging of the head and cervical spine was performed following the standard protocol without intravenous contrast. Multiplanar CT image reconstructions of the cervical spine were also generated. COMPARISON:  CT dated 06/21/2016 FINDINGS: CT HEAD FINDINGS There is moderate age-related atrophy and chronic microvascular ischemic changes. There is no acute intracranial hemorrhage. No mass effect  or midline shift noted. The visualized paranasal sinuses and mastoid air cells are clear. The calvarium is intact. CT CERVICAL SPINE FINDINGS There is no acute fracture or subluxation of the cervical spine.There is osteopenia with multilevel degenerative changes of the spine. There is straightening of normal cervical lordosis. There is disc space narrowing and endplate irregularity most prominent at C5-C6.The odontoid and spinous processes are intact.There is normal anatomic alignment of the C1-C2 lateral masses. The visualized soft tissues appear unremarkable. IMPRESSION: No acute intracranial hemorrhage. No acute/traumatic cervical spine pathology. Electronically Signed   By: Elgie Collard M.D.   On: 06/22/2016 23:17   Procedures Procedures (including critical care time)  Medications Ordered in ED Medications  PARoxetine (PAXIL) tablet 10 mg (10 mg Oral Given 06/23/16 1850)  fosfomycin (MONUROL) packet 3 g (not administered)  sodium chloride 0.9 % bolus 1,000 mL (1,000 mLs Intravenous New Bag/Given 06/23/16 1809)  LORazepam (ATIVAN) tablet 0.5 mg (0.5 mg Oral Given 06/23/16 1848)     Initial Impression / Assessment and Plan / ED Course  I have reviewed the triage vital signs and the nursing notes.  Pertinent labs & imaging results that were available during my care of the patient were reviewed by me and considered in my medical decision making (see chart for details).  Clinical Course   65 year old  female with a history of dementia, COPD, hypertension and seizures presents with concern for sleepiness today. Discussed with Tammy at [facility], who reports that patient has had a change over the last month with decline in ADLS, more frequent falls, and today around lunchtime she was difficult to arouse.  Patient had a CT head done last night following a fall, and also CT the day before, which did not show acute findings. Facility denies any other falls, and no history of anticoagulation, and have low suspicion for acute bleed. There are no focal findings on history provided or exam concerning for CVA.  Patient is awake, and slightly agitated on my exam, and unable to give history per her baseline.  She has frequent movements/muscle twitching, however no seizure-like activity.  Given she just discontinued paxil/exelon, symptoms may represent withdrawal syndrome. Twitching has been reported with withdrawal from paxil and pt given dose in the ED as well as ativan .5mg .   No fever, no leukocytosis, doubt infection/rigors.  XR last night of chest shows no pneumonia, urinalysis shows possible UTI however was also contaminated.  Culture pending and culture from day before negative. Will favor treatment for now and given fosfomycin.  Given patient on Depakote, checked Depakote levels as well as ammonia to evaluate for other etiologies of altered mental status and these were within normal limits.  Labs are significant for acute on chronic kidney disease, mild elevation from yesterday.  Suspect pt unable to drink/eat being in ED late last night, dehydration contributing to symptoms. Will bolus one liter. Given mental status appears back to baseline, no other significant findings, mild change in renal function, recommend close PCP follow up, continued outpatient hydration.   Feel most likely, patient is having a progression of her dementia over the last month with mild AKI, and should follow up closely with a  primary care physician as well as neurology.    Final Clinical Impressions(s) / ED Diagnoses   Final diagnoses:  Disorientation  Dementia, with behavioral disturbance  AKI (acute kidney injury) (HCC)    New Prescriptions New Prescriptions   No medications on file     Alvira Monday, MD  06/23/16 1853  

## 2016-06-23 NOTE — ED Notes (Signed)
Patient moving when vitals were taken; agitated and aggressive.

## 2016-06-24 LAB — URINE CULTURE: CULTURE: NO GROWTH

## 2016-06-27 ENCOUNTER — Emergency Department (HOSPITAL_COMMUNITY)
Admission: EM | Admit: 2016-06-27 | Discharge: 2016-06-27 | Disposition: A | Discharge status: Home / Self Care | Payer: Medicare Other | Attending: Emergency Medicine | Admitting: Emergency Medicine

## 2016-06-27 ENCOUNTER — Encounter (HOSPITAL_COMMUNITY): Payer: Self-pay | Admitting: Emergency Medicine

## 2016-06-27 ENCOUNTER — Emergency Department (HOSPITAL_COMMUNITY): Payer: Medicare Other

## 2016-06-27 DIAGNOSIS — G309 Alzheimer's disease, unspecified: Secondary | ICD-10-CM | POA: Insufficient documentation

## 2016-06-27 DIAGNOSIS — Z7982 Long term (current) use of aspirin: Secondary | ICD-10-CM | POA: Diagnosis not present

## 2016-06-27 DIAGNOSIS — Y939 Activity, unspecified: Secondary | ICD-10-CM | POA: Insufficient documentation

## 2016-06-27 DIAGNOSIS — F0390 Unspecified dementia without behavioral disturbance: Secondary | ICD-10-CM | POA: Diagnosis not present

## 2016-06-27 DIAGNOSIS — I10 Essential (primary) hypertension: Secondary | ICD-10-CM | POA: Insufficient documentation

## 2016-06-27 DIAGNOSIS — Z79899 Other long term (current) drug therapy: Secondary | ICD-10-CM | POA: Diagnosis not present

## 2016-06-27 DIAGNOSIS — Y92129 Unspecified place in nursing home as the place of occurrence of the external cause: Secondary | ICD-10-CM | POA: Diagnosis not present

## 2016-06-27 DIAGNOSIS — J449 Chronic obstructive pulmonary disease, unspecified: Secondary | ICD-10-CM | POA: Diagnosis not present

## 2016-06-27 DIAGNOSIS — Y999 Unspecified external cause status: Secondary | ICD-10-CM | POA: Insufficient documentation

## 2016-06-27 DIAGNOSIS — W19XXXA Unspecified fall, initial encounter: Secondary | ICD-10-CM | POA: Insufficient documentation

## 2016-06-27 NOTE — ED Notes (Signed)
[facility] notified of need for transport. 

## 2016-06-27 NOTE — ED Notes (Signed)
Bed: NH91 Expected date:  Expected time:  Means of arrival:  Comments: EMS from SNF-unwitnessed fall

## 2016-06-27 NOTE — ED Notes (Signed)
Bed: [facility] Expected date:  Expected time:  Means of arrival:  Comments: 

## 2016-06-27 NOTE — ED Triage Notes (Signed)
Per EMS pt from [facility]. Pt had unwitnessed fall and staff believes she hit back of head. Hx of dementia. No blood thinners. Pt recently began wheelchair bound. According to facility staff pt is at her baseline and answering as her usual.

## 2016-06-27 NOTE — ED Provider Notes (Signed)
[facility]-EMERGENCY DEPT Provider Note   CSN: [id number] Arrival date & time: 06/27/16  1308  First Provider Contact:  First MD Initiated Contact with Patient 06/27/16 0451        History   Chief Complaint Chief Complaint  Patient presents with  . Fall    HPI Tanya Rivas is a 65 y.o. female.  The history is provided by the nursing home. The history is limited by the condition of the patient (Dementia).  Fall   65 year old female who was transported here from nursing home where she had an unwitnessed fall. She has severe dementia and is not able to give any history. She is not complaining of anything.  Past Medical History:  Diagnosis Date  . Alzheimer disease   . COPD (chronic obstructive pulmonary disease) (HCC)   . Hepatitis C carrier   . Hypertension   . Seizures [facility])     Patient Active Problem List   Diagnosis Date Noted  . Seizure-like activity (HCC) 06/06/2016  . Dementia 06/06/2016    Past Surgical History:  Procedure Laterality Date  . ABDOMINAL HYSTERECTOMY      OB History    No data available       Home Medications    Prior to Admission medications   Medication Sig Start Date End Date Taking? Authorizing Provider  acetaminophen (TYLENOL) 500 MG tablet Take 500 mg by mouth every 4 (four) hours as needed for mild pain, fever or headache.    Yes Historical Provider, MD  acetaminophen (TYLENOL) 500 MG tablet Take 500 mg by mouth 2 (two) times daily. Scheduled   Yes Historical Provider, MD  alum & mag hydroxide-simeth (MINTOX) 200-200-20 MG/5ML suspension Take 30 mLs by mouth as needed for indigestion or heartburn.   Yes Historical Provider, MD  amLODipine (NORVASC) 10 MG tablet Take 10 mg by mouth daily.   Yes Historical Provider, MD  aspirin 81 MG chewable tablet Chew 81 mg by mouth daily.   Yes Historical Provider, MD  benazepril (LOTENSIN) 10 MG tablet Take 10 mg by mouth daily.   Yes Historical Provider, MD  cholecalciferol (VITAMIN D) 1000 UNITS  tablet Take 1,000 Units by mouth daily.   Yes Historical Provider, MD  divalproex (DEPAKOTE) 500 MG DR tablet Take 2 tablets (1,000 mg total) by mouth 2 (two) times daily. 06/06/16  Yes Levert Feinstein, MD  Fluticasone-Salmeterol (ADVAIR) 250-50 MCG/DOSE AEPB Inhale 1 puff into the lungs 2 (two) times daily.   Yes Historical Provider, MD  furosemide (LASIX) 20 MG tablet Take 20 mg by mouth daily.   Yes Historical Provider, MD  guaifenesin (ROBAFEN) 100 MG/5ML syrup Take 200 mg by mouth every 6 (six) hours as needed for cough.   Yes Historical Provider, MD  isosorbide mononitrate (IMDUR) 60 MG 24 hr tablet Take 60 mg by mouth daily.   Yes Historical Provider, MD  loperamide (IMODIUM) 2 MG capsule Take 2 mg by mouth as needed for diarrhea or loose stools.   Yes Historical Provider, MD  LORazepam (ATIVAN) 0.5 MG tablet Take 0.5 mg by mouth every 8 (eight) hours as needed (agitation).    Yes Historical Provider, MD  LORazepam (ATIVAN) 0.5 MG tablet Take 0.5 mg by mouth 2 (two) times daily.    Yes Historical Provider, MD  magnesium hydroxide (MILK OF MAGNESIA) 400 MG/5ML suspension Take 30 mLs by mouth at bedtime as needed for mild constipation.   Yes Historical Provider, MD  Melatonin 3 MG TABS Take 1 tablet by mouth  at bedtime.   Yes Historical Provider, MD  metoprolol (LOPRESSOR) 50 MG tablet Take 50 mg by mouth 2 (two) times daily.   Yes Historical Provider, MD  Multiple Vitamin (DAILY VITE) TABS Take 1 tablet by mouth daily.   Yes Historical Provider, MD  neomycin-bacitracin-polymyxin (NEOSPORIN) [phone] ointment Apply 1 application topically daily as needed (skin tears or abrasions.).    Yes Historical Provider, MD  QUEtiapine (SEROQUEL) 50 MG tablet Take 50 mg by mouth 2 (two) times daily.    Yes Historical Provider, MD  ranolazine (RANEXA) 1000 MG SR tablet Take 1,000 mg by mouth 2 (two) times daily.   Yes Historical Provider, MD  rivastigmine (EXELON) 1.5 MG capsule Take 1.5 mg by mouth 2 (two) times  daily. For 14 days   Yes Historical Provider, MD  thiamine (VITAMIN B-1) 100 MG tablet Take 100 mg by mouth daily.   Yes Historical Provider, MD  tiotropium (SPIRIVA) 18 MCG inhalation capsule Place 18 mcg into inhaler and inhale daily.   Yes Historical Provider, MD  traZODone (DESYREL) 50 MG tablet Take 50 mg by mouth at bedtime.   Yes Historical Provider, MD  VENTOLIN HFA 108 (90 BASE) MCG/ACT inhaler Inhale 1 puff into the lungs every 4 (four) hours as needed for shortness of breath.  03/29/15  Yes Historical Provider, MD    Family History Family History  Problem Relation Age of Onset  . Family history unknown: Yes    Social History Social History  Substance Use Topics  . Smoking status: Never Smoker  . Smokeless tobacco: Never Used  . Alcohol use No     Allergies   Review of patient's allergies indicates no known allergies.   Review of Systems Review of Systems  Unable to perform ROS: Dementia     Physical Exam Updated Vital Signs BP (!) 132/105 (BP Location: Left Arm)   Pulse 61   Temp 98.1 F (36.7 C) (Oral)   Resp 18   SpO2 100%   Physical Exam  Nursing note and vitals reviewed.  65 year old female, resting comfortably and in no acute distress. Vital signs are significant for hypertension. Oxygen saturation is 100%, which is normal. Head is normocephalic and atraumatic. PERRLA, EOMI. Oropharynx is clear. Neck is nontender without adenopathy or JVD. Back is nontender and there is no CVA tenderness. Lungs are clear without rales, wheezes, or rhonchi. Chest is nontender. Heart has regular rate and rhythm without murmur. Abdomen is soft, flat, nontender without masses or hepatosplenomegaly and peristalsis is normoactive. Extremities have no cyanosis or edema, full range of motion is present. Skin is warm and dry without rash. Neurologic: She is awake but not oriented to person, place, or time; cranial nerves are intact, there are no motor or sensory  deficits.   ED Treatments / Results   Radiology Ct Head Wo Contrast  Result Date: 06/27/2016 CLINICAL DATA:  Unwitnessed fall, struck posterior head. No loss of consciousness. History of hypertension, seizures and Alzheimer's. EXAM: CT HEAD WITHOUT CONTRAST CT CERVICAL SPINE WITHOUT CONTRAST TECHNIQUE: Multidetector CT imaging of the head and cervical spine was performed following the standard protocol without intravenous contrast. Multiplanar CT image reconstructions of the cervical spine were also generated. COMPARISON:  CT HEAD and cervical spine June 22, 2016. FINDINGS: CT HEAD FINDINGS INTRACRANIAL CONTENTS: Moderate to severe ventriculomegaly on the basis of global parenchymal brain volume loss. No intraparenchymal hemorrhage, mass effect nor midline shift. Patchy supratentorial white matter hypodensities are within normal range for patient's  age and though non-specific likely represent chronic small vessel ischemic disease. No acute large vascular territory infarcts. No abnormal extra-axial fluid collections. Basal cisterns are patent. Moderate calcific atherosclerosis of the carotid siphons. ORBITS: The included ocular globes and orbital contents are non-suspicious. SINUSES: The mastoid aircells and included paranasal sinuses are well-aerated. SKULL/SOFT TISSUES: No skull fracture. No significant soft tissue swelling. LEFT anterior skullbase fibrous dysplasia. CT CERVICAL SPINE FINDINGS ALIGNMENT: Cervical vertebral bodies in alignment. Straightened cervical lordosis. OSSEOUS STRUCTURES: Cervical vertebral bodies and posterior elements are intact. Severe C5-6 disc height loss with uncovertebral hypertrophy and subchondral cyst formation. Dx severe LEFT C4-5 and RIGHT upper cervical facet arthropathy. No destructive bony lesions. C1-2 articulation maintained. SOFT TISSUES: Included prevertebral and paraspinal soft tissues are unchanged. Biapical fibronodular scarring. IMPRESSION: CT HEAD: No acute  intracranial process. Stable chronic changes including moderate to severe global brain atrophy, advanced for age. CT CERVICAL SPINE: Straightened cervical lordosis without acute fracture or malalignment. Electronically Signed   By: Awilda Metro M.D.   On: 06/27/2016 05:32   Ct Cervical Spine Wo Contrast  Result Date: 06/27/2016 CLINICAL DATA:  Unwitnessed fall, struck posterior head. No loss of consciousness. History of hypertension, seizures and Alzheimer's. EXAM: CT HEAD WITHOUT CONTRAST CT CERVICAL SPINE WITHOUT CONTRAST TECHNIQUE: Multidetector CT imaging of the head and cervical spine was performed following the standard protocol without intravenous contrast. Multiplanar CT image reconstructions of the cervical spine were also generated. COMPARISON:  CT HEAD and cervical spine June 22, 2016. FINDINGS: CT HEAD FINDINGS INTRACRANIAL CONTENTS: Moderate to severe ventriculomegaly on the basis of global parenchymal brain volume loss. No intraparenchymal hemorrhage, mass effect nor midline shift. Patchy supratentorial white matter hypodensities are within normal range for patient's age and though non-specific likely represent chronic small vessel ischemic disease. No acute large vascular territory infarcts. No abnormal extra-axial fluid collections. Basal cisterns are patent. Moderate calcific atherosclerosis of the carotid siphons. ORBITS: The included ocular globes and orbital contents are non-suspicious. SINUSES: The mastoid aircells and included paranasal sinuses are well-aerated. SKULL/SOFT TISSUES: No skull fracture. No significant soft tissue swelling. LEFT anterior skullbase fibrous dysplasia. CT CERVICAL SPINE FINDINGS ALIGNMENT: Cervical vertebral bodies in alignment. Straightened cervical lordosis. OSSEOUS STRUCTURES: Cervical vertebral bodies and posterior elements are intact. Severe C5-6 disc height loss with uncovertebral hypertrophy and subchondral cyst formation. Dx severe LEFT C4-5 and RIGHT  upper cervical facet arthropathy. No destructive bony lesions. C1-2 articulation maintained. SOFT TISSUES: Included prevertebral and paraspinal soft tissues are unchanged. Biapical fibronodular scarring. IMPRESSION: CT HEAD: No acute intracranial process. Stable chronic changes including moderate to severe global brain atrophy, advanced for age. CT CERVICAL SPINE: Straightened cervical lordosis without acute fracture or malalignment. Electronically Signed   By: Awilda Metro M.D.   On: 06/27/2016 05:32    Procedures Procedures (including critical care time)   Initial Impression / Assessment and Plan / ED Course  I have reviewed the triage vital signs and the nursing notes.  Pertinent imaging results that were available during my care of the patient were reviewed by me and considered in my medical decision making (see chart for details).  Clinical Course   Unwitnessed fall at nursing home, possibly hit head. She will be sent for CT of head and cervical spine. Old records are reviewed, and she has several prior ED visits for falls.  CT scans show no acute injury and she is discharged back to her nursing care facility.  Final Clinical Impressions(s) / ED Diagnoses   Final diagnoses:  Fall at nursing home, initial encounter    New Prescriptions New Prescriptions   No medications on file     Dione Booze, MD 06/27/16 [id number]

## 2016-07-13 ENCOUNTER — Ambulatory Visit (INDEPENDENT_AMBULATORY_CARE_PROVIDER_SITE_OTHER): Admitting: Neurology

## 2016-07-13 DIAGNOSIS — F0391 Unspecified dementia with behavioral disturbance: Secondary | ICD-10-CM

## 2016-07-13 DIAGNOSIS — R569 Unspecified convulsions: Secondary | ICD-10-CM | POA: Diagnosis not present

## 2016-07-18 ENCOUNTER — Encounter (HOSPITAL_COMMUNITY): Payer: Self-pay

## 2016-07-18 ENCOUNTER — Emergency Department (HOSPITAL_COMMUNITY)
Admission: EM | Admit: 2016-07-18 | Discharge: 2016-07-18 | Disposition: A | Discharge status: Home / Self Care | Attending: Emergency Medicine | Admitting: Emergency Medicine

## 2016-07-18 DIAGNOSIS — Y999 Unspecified external cause status: Secondary | ICD-10-CM | POA: Diagnosis not present

## 2016-07-18 DIAGNOSIS — Z79899 Other long term (current) drug therapy: Secondary | ICD-10-CM | POA: Insufficient documentation

## 2016-07-18 DIAGNOSIS — W050XXA Fall from non-moving wheelchair, initial encounter: Secondary | ICD-10-CM | POA: Diagnosis not present

## 2016-07-18 DIAGNOSIS — I10 Essential (primary) hypertension: Secondary | ICD-10-CM | POA: Diagnosis not present

## 2016-07-18 DIAGNOSIS — S0083XA Contusion of other part of head, initial encounter: Secondary | ICD-10-CM | POA: Insufficient documentation

## 2016-07-18 DIAGNOSIS — Z7982 Long term (current) use of aspirin: Secondary | ICD-10-CM | POA: Insufficient documentation

## 2016-07-18 DIAGNOSIS — J449 Chronic obstructive pulmonary disease, unspecified: Secondary | ICD-10-CM | POA: Insufficient documentation

## 2016-07-18 DIAGNOSIS — Y939 Activity, unspecified: Secondary | ICD-10-CM | POA: Diagnosis not present

## 2016-07-18 DIAGNOSIS — Y9289 Other specified places as the place of occurrence of the external cause: Secondary | ICD-10-CM | POA: Insufficient documentation

## 2016-07-18 DIAGNOSIS — S0990XA Unspecified injury of head, initial encounter: Secondary | ICD-10-CM | POA: Diagnosis present

## 2016-07-18 DIAGNOSIS — G309 Alzheimer's disease, unspecified: Secondary | ICD-10-CM | POA: Diagnosis not present

## 2016-07-18 NOTE — ED Notes (Signed)
Pt continues to attempt to remove clothing.  Repositioned.

## 2016-07-18 NOTE — ED Notes (Signed)
[facility] Expecting return.

## 2016-07-18 NOTE — Discharge Instructions (Signed)
It was our pleasure to provide your ER care today - we hope that you feel better.  Please use fall precautions.  Ice/coldpack to sore area.  Return to ER if worse, new symptoms, persistent vomiting, change in mental status, new or severe pain, other concern.

## 2016-07-18 NOTE — ED Triage Notes (Signed)
Per EMS[facility]  Pt had witnessed fall from wheelchair. Struck head on floor.  Bruising to forehead.  No LOC.  No blood thinners.Per staff, pt presents at baseline dementia.  Vitals:  130/94, hr 64, resp 18, cbg 84

## 2016-07-18 NOTE — ED Provider Notes (Signed)
[facility]-EMERGENCY DEPT Provider Note   CSN: [id number] Arrival date & time: 07/18/16  1140     History   Chief [id number] Chief Complaint  Patient presents with  . Fall  . Head Injury    HPI Tanya JanskyWanda Grail Wallace Rivas is a 65 y.o. female.  Patient with hx dementia, witnessed fall at ecf.  Contusion to forehead. No loss of consciousness, or change in level of consciousness since fall.  Patients mental status reported to remain c/w baseline.  No anticoag use. Patient alert and content appearing, moving bilateral extremities purposefully.  Denies pain, but doesn't respond to majority of questions - level 5 caveat - dementia.      The history is provided by the patient and the EMS personnel. The history is limited by the condition of the patient.  Fall   Head Injury   Pertinent negatives include no vomiting.    Past Medical History:  Diagnosis Date  . Alzheimer disease   . COPD (chronic obstructive pulmonary disease) (HCC)   . Hepatitis C carrier   . Hypertension   . Seizures [facility](HCC)     Patient Active Problem List   Diagnosis Date Noted  . Seizure-like activity (HCC) 06/06/2016  . Dementia 06/06/2016    Past Surgical History:  Procedure Laterality Date  . ABDOMINAL HYSTERECTOMY      OB History    No data available       Home Medications    Prior to Admission medications   Medication Sig Start Date End Date Taking? Authorizing Provider  acetaminophen (TYLENOL) 500 MG tablet Take 500 mg by mouth every 4 (four) hours as needed for mild pain, fever or headache.     Historical Provider, MD  acetaminophen (TYLENOL) 500 MG tablet Take 500 mg by mouth 2 (two) times daily. Scheduled    Historical Provider, MD  alum & mag hydroxide-simeth (MINTOX) 200-200-20 MG/5ML suspension Take 30 mLs by mouth as needed for indigestion or heartburn.    Historical Provider, MD  amLODipine (NORVASC) 10 MG tablet Take 10 mg by mouth daily.    Historical Provider, MD  aspirin 81 MG chewable tablet  Chew 81 mg by mouth daily.    Historical Provider, MD  benazepril (LOTENSIN) 10 MG tablet Take 10 mg by mouth daily.    Historical Provider, MD  cholecalciferol (VITAMIN D) 1000 UNITS tablet Take 1,000 Units by mouth daily.    Historical Provider, MD  divalproex (DEPAKOTE) 500 MG DR tablet Take 2 tablets (1,000 mg total) by mouth 2 (two) times daily. 06/06/16   Levert FeinsteinYijun Yan, MD  Fluticasone-Salmeterol (ADVAIR) 250-50 MCG/DOSE AEPB Inhale 1 puff into the lungs 2 (two) times daily.    Historical Provider, MD  furosemide (LASIX) 20 MG tablet Take 20 mg by mouth daily.    Historical Provider, MD  guaifenesin (ROBAFEN) 100 MG/5ML syrup Take 200 mg by mouth every 6 (six) hours as needed for cough.    Historical Provider, MD  isosorbide mononitrate (IMDUR) 60 MG 24 hr tablet Take 60 mg by mouth daily.    Historical Provider, MD  loperamide (IMODIUM) 2 MG capsule Take 2 mg by mouth as needed for diarrhea or loose stools.    Historical Provider, MD  LORazepam (ATIVAN) 0.5 MG tablet Take 0.5 mg by mouth every 8 (eight) hours as needed (agitation).     Historical Provider, MD  LORazepam (ATIVAN) 0.5 MG tablet Take 0.5 mg by mouth 2 (two) times daily.     Historical Provider, MD  magnesium  hydroxide (MILK OF MAGNESIA) 400 MG/5ML suspension Take 30 mLs by mouth at bedtime as needed for mild constipation.    Historical Provider, MD  Melatonin 3 MG TABS Take 1 tablet by mouth at bedtime.    Historical Provider, MD  metoprolol (LOPRESSOR) 50 MG tablet Take 50 mg by mouth 2 (two) times daily.    Historical Provider, MD  Multiple Vitamin (DAILY VITE) TABS Take 1 tablet by mouth daily.    Historical Provider, MD  neomycin-bacitracin-polymyxin (NEOSPORIN) [phone] ointment Apply 1 application topically daily as needed (skin tears or abrasions.).     Historical Provider, MD  QUEtiapine (SEROQUEL) 50 MG tablet Take 50 mg by mouth 2 (two) times daily.     Historical Provider, MD  ranolazine (RANEXA) 1000 MG SR tablet Take  1,000 mg by mouth 2 (two) times daily.    Historical Provider, MD  rivastigmine (EXELON) 1.5 MG capsule Take 1.5 mg by mouth 2 (two) times daily. For 14 days    Historical Provider, MD  thiamine (VITAMIN B-1) 100 MG tablet Take 100 mg by mouth daily.    Historical Provider, MD  tiotropium (SPIRIVA) 18 MCG inhalation capsule Place 18 mcg into inhaler and inhale daily.    Historical Provider, MD  traZODone (DESYREL) 50 MG tablet Take 50 mg by mouth at bedtime.    Historical Provider, MD  VENTOLIN HFA 108 (90 BASE) MCG/ACT inhaler Inhale 1 puff into the lungs every 4 (four) hours as needed for shortness of breath.  03/29/15   Historical Provider, MD    Family History Family History  Problem Relation Age of Onset  . Family history unknown: Yes    Social History Social History  Substance Use Topics  . Smoking status: Never Smoker  . Smokeless tobacco: Never Used  . Alcohol use No     Allergies   Review of patient's allergies indicates no known allergies.   Review of Systems Review of Systems  Unable to perform ROS: Dementia  Constitutional: Negative for fever.  Gastrointestinal: Negative for vomiting.  level 5 caveat - dementia.    Physical Exam Updated Vital Signs BP 115/56 (BP Location: Right Arm)   Pulse (!) 58   Temp 97.7 F (36.5 C) (Oral)   Resp 18   SpO2 97%   Physical Exam  Constitutional: She appears well-developed and well-nourished. No distress.  HENT:  Nose: Nose normal.  Mouth/Throat: Oropharynx is clear and moist.  Contusion to forehead.  Facial bones/orbits intact.   Eyes: Conjunctivae and EOM are normal. Pupils are equal, round, and reactive to light. No scleral icterus.  Neck: Neck supple. No tracheal deviation present.  No stiffness or rigidity. Spine nt.   Cardiovascular: Normal rate, regular rhythm, normal heart sounds and intact distal pulses.   Pulmonary/Chest: Effort normal and breath sounds normal. No respiratory distress. She exhibits no  tenderness.  Abdominal: Soft. Normal appearance and bowel sounds are normal. She exhibits no distension. There is no tenderness.  Musculoskeletal: She exhibits no edema.  CTLS spine, non tender, aligned, no step off. Moves neck freely/comfortably in all directions.  Moves bil ext purposefully with good stre. No focal sts or bony tenderness. Distal pulses palp.   Neurological: She is alert.  Skin: Skin is warm and dry. No rash noted. She is not diaphoretic.  Psychiatric:  Alert, content.   Nursing note and vitals reviewed.    ED Treatments / Results  Labs (all labs ordered are listed, but only abnormal results are displayed) Labs Reviewed -  No data to display  EKG  EKG Interpretation None       Radiology No results found.  Procedures Procedures (including critical care time)  Medications Ordered in ED Medications - No data to display   Initial Impression / Assessment and Plan / ED Course  I have reviewed the triage vital signs and the nursing notes.  Pertinent labs & imaging results that were available during my care of the patient were reviewed by me and considered in my medical decision making (see chart for details).  Clinical Course    DNR noted on chair.  Patient w advance dementia at baseline.  No loc, and mental status remains c/w baseline.  icepack to contusion/forehead.  Patient appears in no acute pain or discomfort.  Patient currently appears stable for d/c.     Final Clinical Impressions(s) / ED Diagnoses   Final diagnoses:  None    New Prescriptions New Prescriptions   No medications on file     Cathren Laine, MD 07/18/16 1241

## 2016-07-18 NOTE — ED Notes (Signed)
Moved pt to hallway for safety.

## 2016-07-18 NOTE — ED Notes (Signed)
Notified [facility] for transport 

## 2016-07-28 NOTE — Procedures (Signed)
   HISTORY: 65 years old female with history of dementia presented with frequent seizure-like activities.  TECHNIQUE:  16 channel EEG was performed based on standard 10-16 international system. One channel was dedicated to EKG, which has demonstrates regular cardiac rhythm.  The recording quality was interrupted because patient's frequent moving, there are frequent muscle artifact.   The posterior background activity was low amplitude, theta range, reactive to eye opening and closure.  There was no evidence of epileptiform discharge. But the tracing was interrupted by frequent muscle artifact.  Photic stimulation and hyperventilation was not performed.   No sleep was achieved.  CONCLUSION: This is an abnormal and technically suboptimal EEG study.  There is electrodiagnostic evidence of bi-hemisphere malfunction. There is no evidence of epileptiform discharges.  Levert FeinsteinYijun Vala Raffo, M.D. Ph.D.  [facility] [address] [place], [place] [address] Phone: [phone] Fax:      [phone]

## 2016-07-31 ENCOUNTER — Emergency Department (HOSPITAL_COMMUNITY)
Admission: EM | Admit: 2016-07-31 | Discharge: 2016-07-31 | Disposition: A | Discharge status: Home / Self Care | Attending: Emergency Medicine | Admitting: Emergency Medicine

## 2016-07-31 ENCOUNTER — Encounter (HOSPITAL_COMMUNITY): Payer: Self-pay

## 2016-07-31 ENCOUNTER — Emergency Department (HOSPITAL_COMMUNITY)

## 2016-07-31 DIAGNOSIS — Y939 Activity, unspecified: Secondary | ICD-10-CM | POA: Insufficient documentation

## 2016-07-31 DIAGNOSIS — Z79899 Other long term (current) drug therapy: Secondary | ICD-10-CM | POA: Insufficient documentation

## 2016-07-31 DIAGNOSIS — G309 Alzheimer's disease, unspecified: Secondary | ICD-10-CM | POA: Diagnosis not present

## 2016-07-31 DIAGNOSIS — W19XXXA Unspecified fall, initial encounter: Secondary | ICD-10-CM | POA: Insufficient documentation

## 2016-07-31 DIAGNOSIS — Y999 Unspecified external cause status: Secondary | ICD-10-CM | POA: Insufficient documentation

## 2016-07-31 DIAGNOSIS — I10 Essential (primary) hypertension: Secondary | ICD-10-CM | POA: Insufficient documentation

## 2016-07-31 DIAGNOSIS — Z043 Encounter for examination and observation following other accident: Secondary | ICD-10-CM | POA: Insufficient documentation

## 2016-07-31 DIAGNOSIS — Y92009 Unspecified place in unspecified non-institutional (private) residence as the place of occurrence of the external cause: Secondary | ICD-10-CM

## 2016-07-31 DIAGNOSIS — J449 Chronic obstructive pulmonary disease, unspecified: Secondary | ICD-10-CM | POA: Insufficient documentation

## 2016-07-31 DIAGNOSIS — Y9289 Other specified places as the place of occurrence of the external cause: Secondary | ICD-10-CM | POA: Diagnosis not present

## 2016-07-31 NOTE — ED Provider Notes (Signed)
[facility]-EMERGENCY DEPT Provider Note   CSN: [id number] Arrival date & time: 07/31/16  0019     History   Chief Complaint Chief Complaint  Patient presents with  . Fall    HPI Tanya Rivas is a 65 y.o. female.  The history is provided by the EMS personnel. The history is limited by the condition of the patient.  Fall  This is a recurrent problem. Episode onset: unknown as was unwitnesses. The problem occurs constantly. The problem has not changed since onset.Pertinent negatives include no chest pain. Nothing aggravates the symptoms. Nothing relieves the symptoms. She has tried nothing for the symptoms. The treatment provided no relief.    Past Medical History:  Diagnosis Date  . Alzheimer disease   . COPD (chronic obstructive pulmonary disease) (HCC)   . Hepatitis C carrier   . Hypertension   . Seizures [facility])     Patient Active Problem List   Diagnosis Date Noted  . Seizure-like activity (HCC) 06/06/2016  . Dementia 06/06/2016    Past Surgical History:  Procedure Laterality Date  . ABDOMINAL HYSTERECTOMY      OB History    No data available       Home Medications    Prior to Admission medications   Medication Sig Start Date End Date Taking? Authorizing Provider  acetaminophen (TYLENOL) 500 MG tablet Take 500 mg by mouth every 4 (four) hours as needed for mild pain, fever or headache.     Historical Provider, MD  acetaminophen (TYLENOL) 500 MG tablet Take 500 mg by mouth 2 (two) times daily. Scheduled    Historical Provider, MD  alum & mag hydroxide-simeth (MINTOX) 200-200-20 MG/5ML suspension Take 30 mLs by mouth as needed for indigestion or heartburn.    Historical Provider, MD  amLODipine (NORVASC) 10 MG tablet Take 10 mg by mouth daily.    Historical Provider, MD  aspirin 81 MG chewable tablet Chew 81 mg by mouth daily.    Historical Provider, MD  benazepril (LOTENSIN) 10 MG tablet Take 10 mg by mouth daily.    Historical Provider, MD  cholecalciferol  (VITAMIN D) 1000 UNITS tablet Take 1,000 Units by mouth daily.    Historical Provider, MD  divalproex (DEPAKOTE) 500 MG DR tablet Take 2 tablets (1,000 mg total) by mouth 2 (two) times daily. 06/06/16   Levert Feinstein, MD  Fluticasone-Salmeterol (ADVAIR) 250-50 MCG/DOSE AEPB Inhale 1 puff into the lungs 2 (two) times daily.    Historical Provider, MD  furosemide (LASIX) 20 MG tablet Take 20 mg by mouth daily.    Historical Provider, MD  guaifenesin (ROBAFEN) 100 MG/5ML syrup Take 200 mg by mouth every 6 (six) hours as needed for cough.    Historical Provider, MD  isosorbide mononitrate (IMDUR) 60 MG 24 hr tablet Take 60 mg by mouth daily.    Historical Provider, MD  loperamide (IMODIUM) 2 MG capsule Take 2 mg by mouth as needed for diarrhea or loose stools.    Historical Provider, MD  LORazepam (ATIVAN) 0.5 MG tablet Take 0.5 mg by mouth every 8 (eight) hours as needed (agitation).     Historical Provider, MD  LORazepam (ATIVAN) 0.5 MG tablet Take 0.5 mg by mouth 2 (two) times daily.     Historical Provider, MD  magnesium hydroxide (MILK OF MAGNESIA) 400 MG/5ML suspension Take 30 mLs by mouth at bedtime as needed for mild constipation.    Historical Provider, MD  Melatonin 3 MG TABS Take 1 tablet by mouth at bedtime.  Historical Provider, MD  metoprolol (LOPRESSOR) 50 MG tablet Take 50 mg by mouth 2 (two) times daily.    Historical Provider, MD  Multiple Vitamin (DAILY VITE) TABS Take 1 tablet by mouth daily.    Historical Provider, MD  neomycin-bacitracin-polymyxin (NEOSPORIN) [id number] ointment Apply 1 application topically daily as needed (skin tears or abrasions.).     Historical Provider, MD  QUEtiapine (SEROQUEL) 50 MG tablet Take 50 mg by mouth 2 (two) times daily.     Historical Provider, MD  ranolazine (RANEXA) 1000 MG SR tablet Take 1,000 mg by mouth 2 (two) times daily.    Historical Provider, MD  rivastigmine (EXELON) 1.5 MG capsule Take 1.5 mg by mouth 2 (two) times daily. For 14 days     Historical Provider, MD  thiamine (VITAMIN B-1) 100 MG tablet Take 100 mg by mouth daily.    Historical Provider, MD  tiotropium (SPIRIVA) 18 MCG inhalation capsule Place 18 mcg into inhaler and inhale daily.    Historical Provider, MD  traZODone (DESYREL) 50 MG tablet Take 50 mg by mouth at bedtime.    Historical Provider, MD  VENTOLIN HFA 108 (90 BASE) MCG/ACT inhaler Inhale 1 puff into the lungs every 4 (four) hours as needed for shortness of breath.  03/29/15   Historical Provider, MD    Family History Family History  Problem Relation Age of Onset  . Family history unknown: Yes    Social History Social History  Substance Use Topics  . Smoking status: Never Smoker  . Smokeless tobacco: Never Used  . Alcohol use No     Allergies   Review of patient's allergies indicates no known allergies.   Review of Systems Review of Systems  Unable to perform ROS: Dementia  Cardiovascular: Negative for chest pain.     Physical Exam Updated Vital Signs BP (!) 171/106 (BP Location: Left Arm)   Pulse 66   Temp 97 F (36.1 C)   Resp 18   SpO2 100%   Physical Exam  Constitutional: She appears well-developed and well-nourished. No distress.  HENT:  Head: Normocephalic. Head is without raccoon's eyes and without Battle's sign.  Right Ear: No hemotympanum.  Left Ear: No hemotympanum.  Mouth/Throat: No oropharyngeal exudate.  Eyes: EOM are normal.  Neck: Normal range of motion. Neck supple.  Cardiovascular: Normal rate, regular rhythm and intact distal pulses.   Pulmonary/Chest: Effort normal and breath sounds normal. No respiratory distress.  Abdominal: Soft. Bowel sounds are normal. There is no tenderness.  Musculoskeletal: Normal range of motion. She exhibits no edema, tenderness or deformity.  Pelvis is stable  Neurological: She is alert. She has normal reflexes.  Skin: Skin is warm and dry. Capillary refill takes less than 2 seconds.  Psychiatric: She has a normal mood and  affect.     ED Treatments / Results  Labs (all labs ordered are listed, but only abnormal results are displayed) Labs Reviewed - No data to display  EKG  EKG Interpretation None       Radiology No results found.  Procedures Procedures (including critical care time)  Medications Ordered in ED Medications - No data to display   Initial Impression / Assessment and Plan / ED Course  I have reviewed the triage vital signs and the nursing notes.  Pertinent labs & imaging results that were available during my care of the patient were reviewed by me and considered in my medical decision making (see chart for details).  Clinical Course  Final Clinical Impressions(s) / ED Diagnoses   Final diagnoses:  None   Vitals:   07/31/16 0034  BP: (!) 171/106  Pulse: 66  Resp: 18  Temp: 97 F (36.1 C)   Results for orders placed or performed during the hospital encounter of 06/23/16  Valproic acid level  Result Value Ref Range   Valproic Acid Lvl 61 50.0 - 100.0 ug/mL  Ammonia  Result Value Ref Range   Ammonia 12 9 - 35 umol/L  CBC with Differential  Result Value Ref Range   WBC 5.8 4.0 - 10.5 K/uL   RBC 4.77 3.87 - 5.11 MIL/uL   Hemoglobin 13.2 12.0 - 15.0 g/dL   HCT 16.1 09.6 - 04.5 %   MCV 87.2 78.0 - 100.0 fL   MCH 27.7 26.0 - 34.0 pg   MCHC 31.7 30.0 - 36.0 g/dL   RDW 40.9 81.1 - 91.4 %   Platelets 146 (L) 150 - 400 K/uL   Neutrophils Relative % 65 %   Neutro Abs 3.8 1.7 - 7.7 K/uL   Lymphocytes Relative 30 %   Lymphs Abs 1.7 0.7 - 4.0 K/uL   Monocytes Relative 5 %   Monocytes Absolute 0.3 0.1 - 1.0 K/uL   Eosinophils Relative 0 %   Eosinophils Absolute 0.0 0.0 - 0.7 K/uL   Basophils Relative 0 %   Basophils Absolute 0.0 0.0 - 0.1 K/uL  Comprehensive metabolic panel  Result Value Ref Range   Sodium 143 135 - 145 mmol/L   Potassium 4.1 3.5 - 5.1 mmol/L   Chloride 106 101 - 111 mmol/L   CO2 28 22 - 32 mmol/L   Glucose, Bld 114 (H) 65 - 99 mg/dL    BUN 36 (H) 6 - 20 mg/dL   Creatinine, Ser 7.82 (H) 0.44 - 1.00 mg/dL   Calcium 9.3 8.9 - 95.6 mg/dL   Total Protein 8.9 (H) 6.5 - 8.1 g/dL   Albumin 3.7 3.5 - 5.0 g/dL   AST 29 15 - 41 U/L   ALT 18 14 - 54 U/L   Alkaline Phosphatase 62 38 - 126 U/L   Total Bilirubin 1.0 0.3 - 1.2 mg/dL   GFR calc non Af Amer 21 (L) >60 mL/min   GFR calc Af Amer 25 (L) >60 mL/min   Anion gap 9 5 - 15   Ct Head Wo Contrast  Result Date: 07/31/2016 CLINICAL DATA:  Status post unwitnessed fall, with mid frontal scalp hematoma. Concern for head or cervical spine injury. Initial encounter. EXAM: CT HEAD WITHOUT CONTRAST CT CERVICAL SPINE WITHOUT CONTRAST TECHNIQUE: Multidetector CT imaging of the head and cervical spine was performed following the standard protocol without intravenous contrast. Multiplanar CT image reconstructions of the cervical spine were also generated. COMPARISON:  CT of the head and cervical spine performed 06/27/2016 FINDINGS: CT HEAD FINDINGS There is no evidence of acute infarction, mass lesion, or intra- or extra-axial hemorrhage on CT. Prominence of the ventricles and sulci reflects moderate cortical volume loss. Scattered periventricular and subcortical white matter change likely reflects small vessel ischemic microangiopathy. Mild cerebellar atrophy is noted. The brainstem and fourth ventricle are within normal limits. The basal ganglia are unremarkable in appearance. The cerebral hemispheres demonstrate grossly normal Pontiff-white differentiation. No mass effect or midline shift is seen. There is no evidence of fracture; visualized osseous structures are unremarkable in appearance. The orbits are within normal limits. There is mild partial opacification of the right mastoid air cells. Mucoperiosteal thickening is noted at the  left side of the sphenoid sinus. The remaining paranasal sinuses and left mastoid air cells are well-aerated. Mild soft tissue swelling is noted overlying the frontal  calvarium. CT CERVICAL SPINE FINDINGS There is no evidence of fracture or subluxation. Vertebral bodies demonstrate normal height and alignment. Mild disc space narrowing is noted at C5-C6, with mild grade 1 anterolisthesis of C7 on T1, reflecting underlying facet disease. Prevertebral soft tissues are within normal limits. The thyroid gland is unremarkable in appearance. Scarring is noted at the lung apices, with underlying emphysematous change. Scattered calcification is noted at the carotid bifurcations bilaterally. IMPRESSION: 1. No evidence of traumatic intracranial injury or fracture. 2. No evidence of fracture or subluxation along the cervical spine. 3. Mild soft tissue swelling overlying the frontal calvarium. 4. Moderate cortical volume loss and scattered small vessel ischemic microangiopathy. 5. Mild degenerative change along the lower cervical spine. 6. Mild partial opacification of the right mastoid air cells and mucoperiosteal thickening at the left side of the sphenoid sinus. 7. Scarring at the lung apices, with underlying emphysematous change. 8. Scattered calcification at the carotid bifurcations bilaterally. Carotid ultrasound would be helpful for further evaluation, when and as deemed clinically appropriate. Electronically Signed   By: Roanna RaiderJeffery  Chang M.D.   On: 07/31/2016 01:47   Ct Cervical Spine Wo Contrast  Result Date: 07/31/2016 CLINICAL DATA:  Status post unwitnessed fall, with mid frontal scalp hematoma. Concern for head or cervical spine injury. Initial encounter. EXAM: CT HEAD WITHOUT CONTRAST CT CERVICAL SPINE WITHOUT CONTRAST TECHNIQUE: Multidetector CT imaging of the head and cervical spine was performed following the standard protocol without intravenous contrast. Multiplanar CT image reconstructions of the cervical spine were also generated. COMPARISON:  CT of the head and cervical spine performed 06/27/2016 FINDINGS: CT HEAD FINDINGS There is no evidence of acute infarction, mass  lesion, or intra- or extra-axial hemorrhage on CT. Prominence of the ventricles and sulci reflects moderate cortical volume loss. Scattered periventricular and subcortical white matter change likely reflects small vessel ischemic microangiopathy. Mild cerebellar atrophy is noted. The brainstem and fourth ventricle are within normal limits. The basal ganglia are unremarkable in appearance. The cerebral hemispheres demonstrate grossly normal Irby-white differentiation. No mass effect or midline shift is seen. There is no evidence of fracture; visualized osseous structures are unremarkable in appearance. The orbits are within normal limits. There is mild partial opacification of the right mastoid air cells. Mucoperiosteal thickening is noted at the left side of the sphenoid sinus. The remaining paranasal sinuses and left mastoid air cells are well-aerated. Mild soft tissue swelling is noted overlying the frontal calvarium. CT CERVICAL SPINE FINDINGS There is no evidence of fracture or subluxation. Vertebral bodies demonstrate normal height and alignment. Mild disc space narrowing is noted at C5-C6, with mild grade 1 anterolisthesis of C7 on T1, reflecting underlying facet disease. Prevertebral soft tissues are within normal limits. The thyroid gland is unremarkable in appearance. Scarring is noted at the lung apices, with underlying emphysematous change. Scattered calcification is noted at the carotid bifurcations bilaterally. IMPRESSION: 1. No evidence of traumatic intracranial injury or fracture. 2. No evidence of fracture or subluxation along the cervical spine. 3. Mild soft tissue swelling overlying the frontal calvarium. 4. Moderate cortical volume loss and scattered small vessel ischemic microangiopathy. 5. Mild degenerative change along the lower cervical spine. 6. Mild partial opacification of the right mastoid air cells and mucoperiosteal thickening at the left side of the sphenoid sinus. 7. Scarring at the  lung apices, with  underlying emphysematous change. 8. Scattered calcification at the carotid bifurcations bilaterally. Carotid ultrasound would be helpful for further evaluation, when and as deemed clinically appropriate. Electronically Signed   By: Roanna Raider M.D.   On: 07/31/2016 01:47   All questions answered to patient's satisfaction. Based on history and exam patient has been appropriately medically screened and emergency conditions excluded. Patient is stable for discharge at this time. Follow up with your PMD for recheck in 2 days and strict return precautions given.  New Prescriptions New Prescriptions   No medications on file     Marene Gilliam, MD 07/31/16 [phone]

## 2016-07-31 NOTE — ED Triage Notes (Signed)
[facility]. [facility], found patient on floor. EMS reports knee scrapes and has a "knot" to frontal head area. Patient has hx dementia, seizure disorder. EMS reports that patient has been non-verbal except to say ouch when CBG was checked. EMS [facility] was unable to say if this was patient's normal.

## 2016-07-31 NOTE — ED Notes (Signed)
Bed: [id number] Expected date:  Expected time:  Means of arrival:  Comments: EMS 65 yo fall from SNF

## 2016-08-02 ENCOUNTER — Encounter (HOSPITAL_COMMUNITY): Payer: Self-pay | Admitting: Emergency Medicine

## 2016-08-02 ENCOUNTER — Emergency Department (HOSPITAL_COMMUNITY)
Admission: EM | Admit: 2016-08-02 | Discharge: 2016-08-03 | Disposition: A | Discharge status: Home / Self Care | Attending: Emergency Medicine | Admitting: Emergency Medicine

## 2016-08-02 ENCOUNTER — Emergency Department (HOSPITAL_COMMUNITY)

## 2016-08-02 DIAGNOSIS — Y929 Unspecified place or not applicable: Secondary | ICD-10-CM | POA: Diagnosis not present

## 2016-08-02 DIAGNOSIS — G309 Alzheimer's disease, unspecified: Secondary | ICD-10-CM | POA: Diagnosis not present

## 2016-08-02 DIAGNOSIS — Y939 Activity, unspecified: Secondary | ICD-10-CM | POA: Insufficient documentation

## 2016-08-02 DIAGNOSIS — W19XXXA Unspecified fall, initial encounter: Secondary | ICD-10-CM | POA: Diagnosis not present

## 2016-08-02 DIAGNOSIS — J449 Chronic obstructive pulmonary disease, unspecified: Secondary | ICD-10-CM | POA: Diagnosis not present

## 2016-08-02 DIAGNOSIS — Z79899 Other long term (current) drug therapy: Secondary | ICD-10-CM | POA: Diagnosis not present

## 2016-08-02 DIAGNOSIS — I10 Essential (primary) hypertension: Secondary | ICD-10-CM | POA: Insufficient documentation

## 2016-08-02 DIAGNOSIS — S0990XA Unspecified injury of head, initial encounter: Secondary | ICD-10-CM | POA: Diagnosis present

## 2016-08-02 DIAGNOSIS — S0083XA Contusion of other part of head, initial encounter: Secondary | ICD-10-CM | POA: Insufficient documentation

## 2016-08-02 DIAGNOSIS — Y999 Unspecified external cause status: Secondary | ICD-10-CM | POA: Insufficient documentation

## 2016-08-02 MED ORDER — HALOPERIDOL LACTATE 5 MG/ML IJ SOLN
5.0000 mg | Freq: Once | INTRAMUSCULAR | Status: AC
  Administered 2016-08-02: 5 mg via INTRAMUSCULAR
  Filled 2016-08-02: qty 1

## 2016-08-02 NOTE — ED Triage Notes (Signed)
Per [facility], [facility], staff reports pt had unwitnessed fall approx 1.5 feet out of bed. Pt found prone by PT[facility]. Hx dementia.

## 2016-08-02 NOTE — ED Notes (Signed)
Patient transported to CT 

## 2016-08-02 NOTE — ED Notes (Signed)
Bed: WHALB Expected date:  Expected time:  Means of arrival:  Comments: No bed 

## 2016-08-02 NOTE — ED Notes (Signed)
Bed: [id number] Expected date:  Expected time:  Means of arrival:  Comments: EMS 65yo F unwitnessed fall / found on floor

## 2016-08-02 NOTE — ED Provider Notes (Signed)
[facility]-EMERGENCY DEPT Provider Note   CSN: [id number] Arrival date & time: 08/02/16  2116   By signing my name below, I, Christy Sartorius, attest that this documentation has been prepared under the direction and in the presence of [employer], PA-C. Electronically Signed: Christy Sartorius, ED Scribe. 08/02/16. 9:56 PM.  History   Chief Complaint Chief Complaint  Patient presents with  . Fall   The history is provided by the patient and medical records. The history is limited by the condition of the patient. No language interpreter was used.   LEVEL 5 CAVEAT: DEMENTIA  HPI Comments:  Tanya Rivas is a 65 y.o. female with a history of seizure like activity and dementia brought in by ambulance from SNF, who presents to the Emergency Department s/p fall just PTA.  She was found conscious and prone 1-2 feet out of bed.  She is wheelchair bound and does not walk.  Per EMS and SNF pt has a long hx of repeat falls.    Chart review shows that pt has had numerous falls and has been evaluated often in the ED for this.  No documentation of anticoagulants   Past Medical History:  Diagnosis Date  . Alzheimer disease   . COPD (chronic obstructive pulmonary disease) (HCC)   . Hepatitis C carrier   . Hypertension   . Seizures [facility])     Patient Active Problem List   Diagnosis Date Noted  . Seizure-like activity (HCC) 06/06/2016  . Dementia 06/06/2016    Past Surgical History:  Procedure Laterality Date  . ABDOMINAL HYSTERECTOMY      OB History    No data available       Home Medications    Prior to Admission medications   Medication Sig Start Date End Date Taking? Authorizing Provider  acetaminophen (TYLENOL) 500 MG tablet Take 500 mg by mouth every 4 (four) hours as needed for mild pain, fever or headache.    Yes Historical Provider, MD  acetaminophen (TYLENOL) 500 MG tablet Take 500 mg by mouth 2 (two) times daily. Scheduled   Yes Historical Provider, MD  alum &  mag hydroxide-simeth (MINTOX) 200-200-20 MG/5ML suspension Take 30 mLs by mouth as needed for indigestion or heartburn.   Yes Historical Provider, MD  benazepril (LOTENSIN) 10 MG tablet Take 10 mg by mouth daily with breakfast.    Yes Historical Provider, MD  Dimethicone-Zinc Oxide 5-5 % CREA Apply 1 application topically as needed (apply to buttocks/peri area for incontinence).   Yes Historical Provider, MD  divalproex (DEPAKOTE SPRINKLE) 125 MG capsule Take 1,000 mg by mouth 2 (two) times daily.   Yes Historical Provider, MD  gabapentin (NEURONTIN) 100 MG capsule Take 100 mg by mouth at bedtime.   Yes Historical Provider, MD  guaifenesin (ROBAFEN) 100 MG/5ML syrup Take 200 mg by mouth every 6 (six) hours as needed for cough.   Yes Historical Provider, MD  isosorbide mononitrate (IMDUR) 60 MG 24 hr tablet Take 60 mg by mouth daily with breakfast.    Yes Historical Provider, MD  loperamide (IMODIUM) 2 MG capsule Take 2 mg by mouth as needed for diarrhea or loose stools.   Yes Historical Provider, MD  LORazepam (ATIVAN) 0.5 MG tablet Take 0.5 mg by mouth every 8 (eight) hours as needed (agitation).    Yes Historical Provider, MD  LORazepam (ATIVAN) 0.5 MG tablet Take 0.5 mg by mouth 2 (two) times daily.    Yes Historical Provider, MD  magnesium hydroxide (MILK OF  MAGNESIA) 400 MG/5ML suspension Take 30 mLs by mouth at bedtime as needed for mild constipation.   Yes Historical Provider, MD  Melatonin 3 MG TABS Take 1 tablet by mouth at bedtime.   Yes Historical Provider, MD  metoprolol (LOPRESSOR) 50 MG tablet Take 50 mg by mouth daily with breakfast.    Yes Historical Provider, MD  neomycin-bacitracin-polymyxin (NEOSPORIN) [phone] ointment Apply 1 application topically daily as needed (skin tears or abrasions.).    Yes Historical Provider, MD  QUEtiapine (SEROQUEL) 50 MG tablet Take 50 mg by mouth at bedtime.    Yes Historical Provider, MD  ranolazine (RANEXA) 1000 MG SR tablet Take 1,000 mg by mouth  2 (two) times daily.   Yes Historical Provider, MD  risperiDONE (RISPERDAL) 1 MG/ML oral solution Take 0.5 mg by mouth daily with breakfast.   Yes Historical Provider, MD  thiamine (VITAMIN B-1) 100 MG tablet Take 100 mg by mouth daily with breakfast.    Yes Historical Provider, MD  traZODone (DESYREL) 50 MG tablet Take 50 mg by mouth at bedtime.   Yes Historical Provider, MD    Family History Family History  Problem Relation Age of Onset  . Family history unknown: Yes    Social History Social History  Substance Use Topics  . Smoking status: Never Smoker  . Smokeless tobacco: Never Used  . Alcohol use No     Allergies   Review of patient's allergies indicates no known allergies.   Review of Systems Review of Systems  Unable to perform ROS: Dementia     Physical Exam Updated Vital Signs BP 142/100 (BP Location: Left Arm)   Pulse 73   Resp 18   SpO2 93%   Physical Exam  Constitutional: She appears well-developed and well-nourished. No distress.  Awake, alert,  Mumbling to herself agitated  HENT:  Head: Normocephalic.  Mouth/Throat: Oropharynx is clear and moist. No oropharyngeal exudate.  Swelling above the left eye with associated hematoma. Multiple bruises and abrasions on forehead and cheeks in various stages of heeling.  No open lacerations.    Eyes: Conjunctivae are normal. No scleral icterus.  Neck: Normal range of motion. Neck supple.  Cardiovascular: Normal rate, regular rhythm and intact distal pulses.   Pulmonary/Chest: Effort normal and breath sounds normal. No respiratory distress. She has no wheezes.  Equal chest expansion  Abdominal: Soft. Bowel sounds are normal. She exhibits no mass. There is no tenderness. There is no rebound and no guarding.  Soft and non-tender. Well healed midline surgical incision.    Musculoskeletal: Normal range of motion. She exhibits no edema.  Full ROM of upper and lower extremities.   Pelvis is stable.      Neurological: She is alert.  Speech is garbled and confused.  Moves extremities without ataxia  Skin: Skin is warm and dry. She is not diaphoretic.  Psychiatric: She has a normal mood and affect.  Nursing note and vitals reviewed.    ED Treatments / Results   DIAGNOSTIC STUDIES:  Oxygen Saturation is 93% on RA, low by my interpretation.    COORDINATION OF CARE:   Radiology Ct Head Wo Contrast  Result Date: 08/03/2016 CLINICAL DATA:  65 year old female with fall. EXAM: CT HEAD WITHOUT CONTRAST CT MAXILLOFACIAL WITHOUT CONTRAST CT CERVICAL SPINE WITHOUT CONTRAST TECHNIQUE: Multidetector CT imaging of the head, cervical spine, and maxillofacial structures were performed using the standard protocol without intravenous contrast. Multiplanar CT image reconstructions of the cervical spine and maxillofacial structures were also generated. COMPARISON:  CT dated 07/31/2016 . FINDINGS: Evaluation of this exam is limited due to motion artifact. CT HEAD FINDINGS There is moderate age-related atrophy and chronic microvascular ischemic changes. There is dilatation of the ventricles out of proportion with the sulci which may represent central volume loss versus normal pressure hydrocephalus. Clinical correlation is recommended. There is no acute intracranial hemorrhage. No mass effect or midline shift noted. No extra-axial fluid collection identified. The visualized paranasal sinuses and mastoid air cells are clear. The calvarium is intact. CT MAXILLOFACIAL FINDINGS No definite acute facial bone fractures identified. Evaluation however is very limited, almost nondiagnostic, due to motion artifact. The globes and retro-orbital fat are preserved. The visualized paranasal sinuses are clear. Soft tissues appear unremarkable. CT CERVICAL SPINE FINDINGS There is no acute fracture or subluxation of the cervical spine.There is osteopenia with multilevel degenerative changes.The odontoid and spinous processes are  intact.There is normal anatomic alignment of the C1-C2 lateral masses. The visualized soft tissues appear unremarkable. IMPRESSION: No acute intracranial hemorrhage. Age-related atrophy and chronic microvascular ischemic changes. No acute/traumatic cervical spine pathology. No definite facial bone fractures on a very limited evaluation. Electronically Signed   By: Elgie CollardArash  Radparvar M.D.   On: 08/03/2016 03:23   Ct Cervical Spine Wo Contrast  Result Date: 08/03/2016 CLINICAL DATA:  65 year old female with fall. EXAM: CT HEAD WITHOUT CONTRAST CT MAXILLOFACIAL WITHOUT CONTRAST CT CERVICAL SPINE WITHOUT CONTRAST TECHNIQUE: Multidetector CT imaging of the head, cervical spine, and maxillofacial structures were performed using the standard protocol without intravenous contrast. Multiplanar CT image reconstructions of the cervical spine and maxillofacial structures were also generated. COMPARISON:  CT dated 07/31/2016 . FINDINGS: Evaluation of this exam is limited due to motion artifact. CT HEAD FINDINGS There is moderate age-related atrophy and chronic microvascular ischemic changes. There is dilatation of the ventricles out of proportion with the sulci which may represent central volume loss versus normal pressure hydrocephalus. Clinical correlation is recommended. There is no acute intracranial hemorrhage. No mass effect or midline shift noted. No extra-axial fluid collection identified. The visualized paranasal sinuses and mastoid air cells are clear. The calvarium is intact. CT MAXILLOFACIAL FINDINGS No definite acute facial bone fractures identified. Evaluation however is very limited, almost nondiagnostic, due to motion artifact. The globes and retro-orbital fat are preserved. The visualized paranasal sinuses are clear. Soft tissues appear unremarkable. CT CERVICAL SPINE FINDINGS There is no acute fracture or subluxation of the cervical spine.There is osteopenia with multilevel degenerative changes.The odontoid  and spinous processes are intact.There is normal anatomic alignment of the C1-C2 lateral masses. The visualized soft tissues appear unremarkable. IMPRESSION: No acute intracranial hemorrhage. Age-related atrophy and chronic microvascular ischemic changes. No acute/traumatic cervical spine pathology. No definite facial bone fractures on a very limited evaluation. Electronically Signed   By: Elgie CollardArash  Radparvar M.D.   On: 08/03/2016 03:23   Ct Maxillofacial Wo Contrast  Result Date: 08/03/2016 CLINICAL DATA:  65 year old female with fall. EXAM: CT HEAD WITHOUT CONTRAST CT MAXILLOFACIAL WITHOUT CONTRAST CT CERVICAL SPINE WITHOUT CONTRAST TECHNIQUE: Multidetector CT imaging of the head, cervical spine, and maxillofacial structures were performed using the standard protocol without intravenous contrast. Multiplanar CT image reconstructions of the cervical spine and maxillofacial structures were also generated. COMPARISON:  CT dated 07/31/2016 . FINDINGS: Evaluation of this exam is limited due to motion artifact. CT HEAD FINDINGS There is moderate age-related atrophy and chronic microvascular ischemic changes. There is dilatation of the ventricles out of proportion with the sulci which may represent central volume loss versus  normal pressure hydrocephalus. Clinical correlation is recommended. There is no acute intracranial hemorrhage. No mass effect or midline shift noted. No extra-axial fluid collection identified. The visualized paranasal sinuses and mastoid air cells are clear. The calvarium is intact. CT MAXILLOFACIAL FINDINGS No definite acute facial bone fractures identified. Evaluation however is very limited, almost nondiagnostic, due to motion artifact. The globes and retro-orbital fat are preserved. The visualized paranasal sinuses are clear. Soft tissues appear unremarkable. CT CERVICAL SPINE FINDINGS There is no acute fracture or subluxation of the cervical spine.There is osteopenia with multilevel  degenerative changes.The odontoid and spinous processes are intact.There is normal anatomic alignment of the C1-C2 lateral masses. The visualized soft tissues appear unremarkable. IMPRESSION: No acute intracranial hemorrhage. Age-related atrophy and chronic microvascular ischemic changes. No acute/traumatic cervical spine pathology. No definite facial bone fractures on a very limited evaluation. Electronically Signed   By: Elgie Collard M.D.   On: 08/03/2016 03:23    Procedures Procedures (including critical care time)  Medications Ordered in ED Medications  haloperidol lactate (HALDOL) injection 5 mg (5 mg Intramuscular Given 08/02/16 2246)  LORazepam (ATIVAN) injection 1 mg (1 mg Intravenous Given 08/03/16 0143)     Initial Impression / Assessment and Plan / ED Course  I have reviewed the triage vital signs and the nursing notes.  Pertinent labs & imaging results that were available during my care of the patient were reviewed by me and considered in my medical decision making (see chart for details).  Clinical Course  Value Comment By Time  Pulse Rate: 73 VSS.  No tachycardia.  Mild hypertension; hx of same.   Dahlia Client [facility], PA-C 09/06 2142   Pt with some decrease in agitation after Geodon.  Increased calm after ativan.  Ct head/neck pending Dierdre Forth, PA-C 09/07 0211  CT Head Wo Contrast No acute abnormality Dierdre Forth, PA-C 09/07 1610   Pt does not walk at skilled care facility [facility], PA-C 09/07 0338    Pt with unwitnessed fall.  No open lacerations.  CT head/neck without acute abnormality.  Pt moving all extremities without difficulty.  Doubt long bone fracture.  Pt is sleeping peacefully. Will be d/c home.    Final Clinical Impressions(s) / ED Diagnoses   Final diagnoses:  Fall, initial encounter  Facial contusion, initial encounter    New Prescriptions Current Discharge Medication List     I personally performed the services  described in this documentation, which was scribed in my presence. The recorded information has been reviewed and is accurate.    Dahlia Client Lewanna Petrak, PA-C 08/03/16 [id number]    Benjiman Core, MD 08/04/16 [phone]

## 2016-08-03 MED ORDER — LORAZEPAM 2 MG/ML IJ SOLN
1.0000 mg | Freq: Once | INTRAMUSCULAR | Status: AC
  Administered 2016-08-03: 1 mg via INTRAVENOUS
  Filled 2016-08-03: qty 1

## 2016-08-03 NOTE — ED Notes (Signed)
[facility] was called for pt's transportation back to [facility]. 

## 2016-08-03 NOTE — Discharge Instructions (Signed)
1. Medications: usual home medications 2. Treatment: rest, drink plenty of fluids, ice to hematoma 3. Follow Up: Please followup with your primary doctor in 1-2 days for discussion of your diagnoses and further evaluation after today's visit; if you do not have a primary care doctor use the resource guide provided to find one; Please return to the ER for increased confusion, seizures, repeat falls

## 2016-08-03 NOTE — ED Notes (Signed)
[facility] here to transport pt back to [facility]. 

## 2016-08-03 NOTE — ED Notes (Signed)
[facility] on [facility]'s condition and discharge.

## 2016-08-27 DEATH — deceased

## 2016-09-07 ENCOUNTER — Ambulatory Visit: Payer: Medicare Other | Admitting: Nurse Practitioner

## 2017-01-26 IMAGING — CR DG CHEST 1V
1 series · 1 of 1 positions shown · non-contrast
Comparison: 06/21/2016

CLINICAL DATA: Recent fall

EXAM:
CHEST 1 VIEW

[x chest ap]
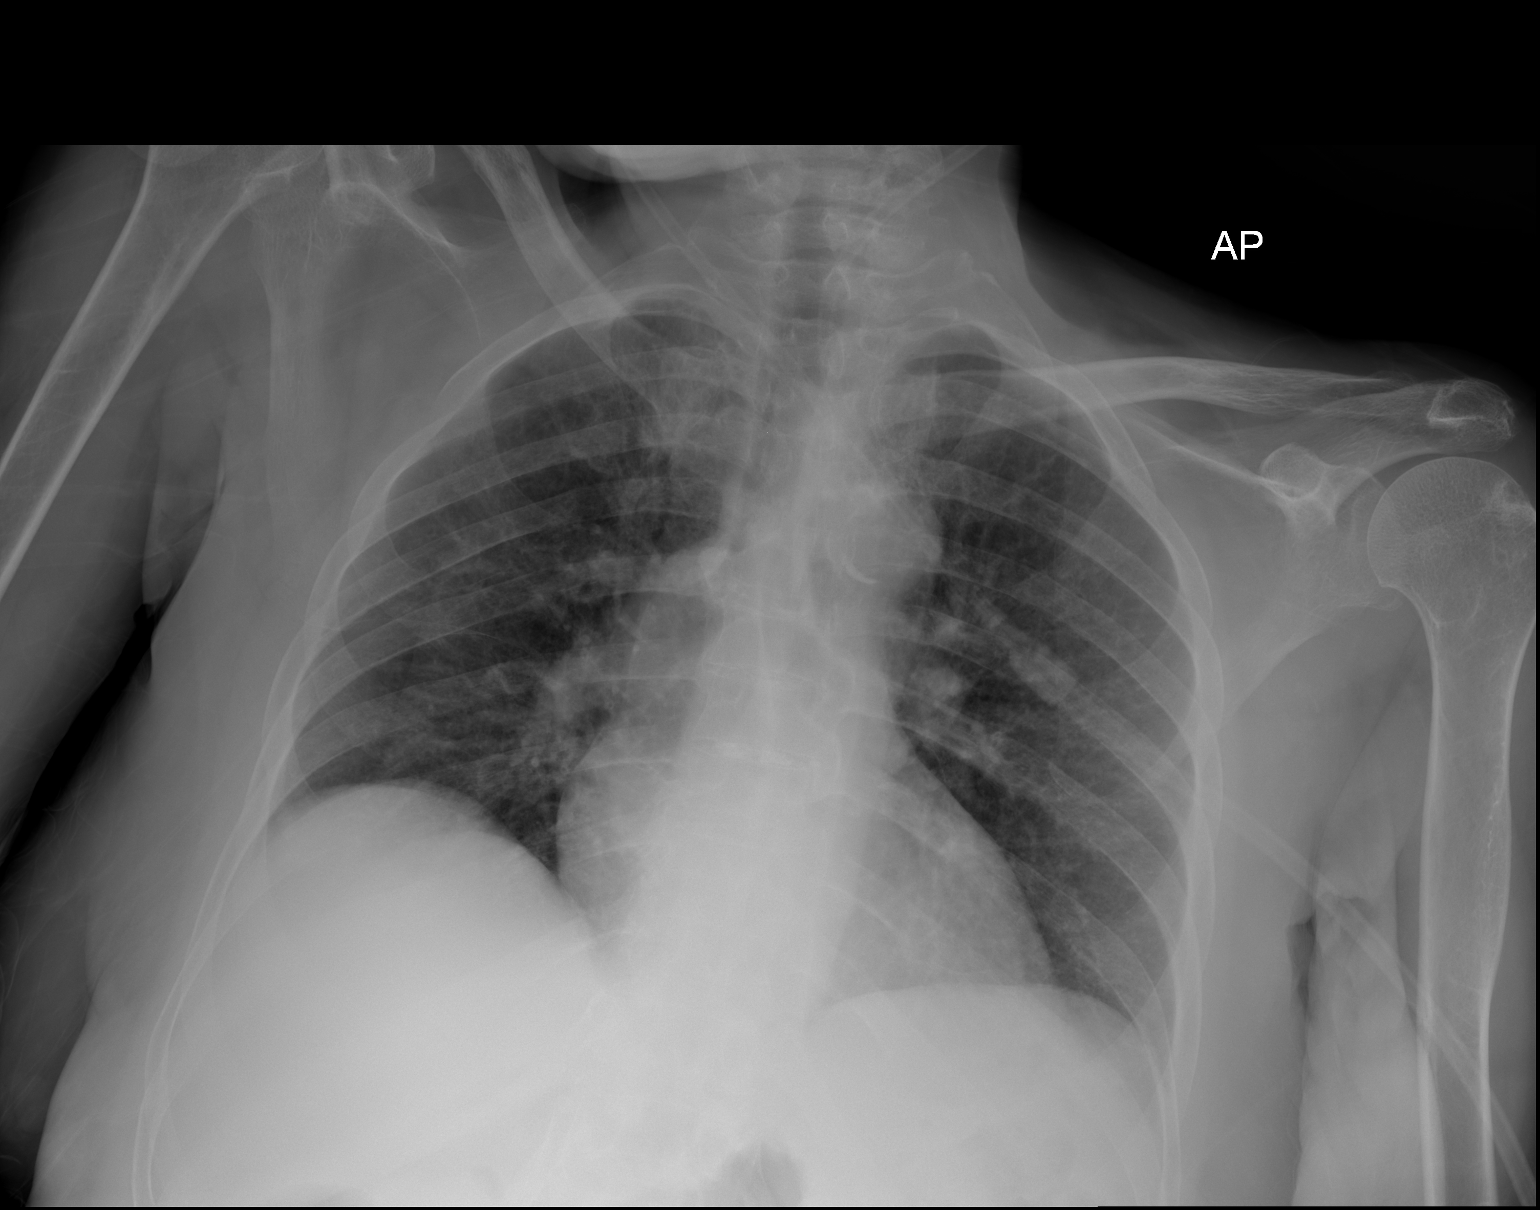

[1 of 1 positions shown; findings below may reference images not displayed]

FINDINGS: The heart size and mediastinal contours are within normal limits.
Both lungs are clear. The visualized skeletal structures are
unremarkable.
IMPRESSION: No active disease.

## 2017-01-31 IMAGING — CT CT HEAD W/O CM
3 of 8 series · 13 of 47 positions shown, 15 images · non-contrast
Comparison: CT HEAD and cervical spine June 22, 2016.

CLINICAL DATA: Unwitnessed fall, struck posterior head. No loss of
consciousness. History of hypertension, seizures and Alzheimer's.

EXAM:
CT HEAD WITHOUT CONTRAST
CT CERVICAL SPINE WITHOUT CONTRAST
TECHNIQUE: Multidetector CT imaging of the head and cervical spine was
performed following the standard protocol without intravenous
contrast. Multiplanar CT image reconstructions of the cervical spine
were also generated.

[Series 5: coronal · coronal · 0.30mm/px · 3 of 61 slices shown]
[im 23/61  brain]
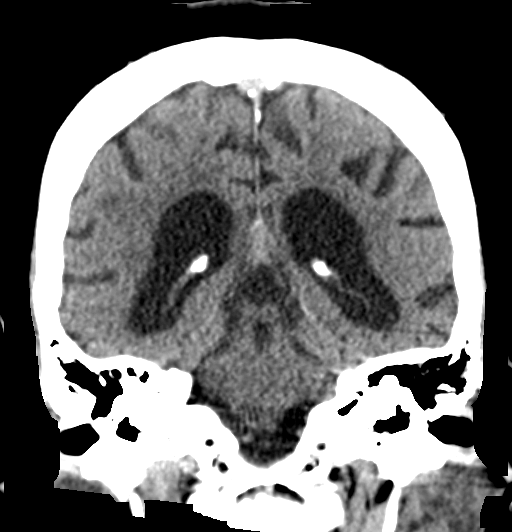
[im 31/61  brain]
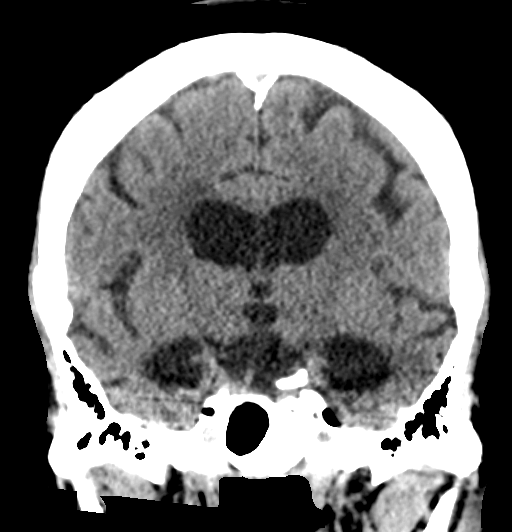
[im 38/61  brain]
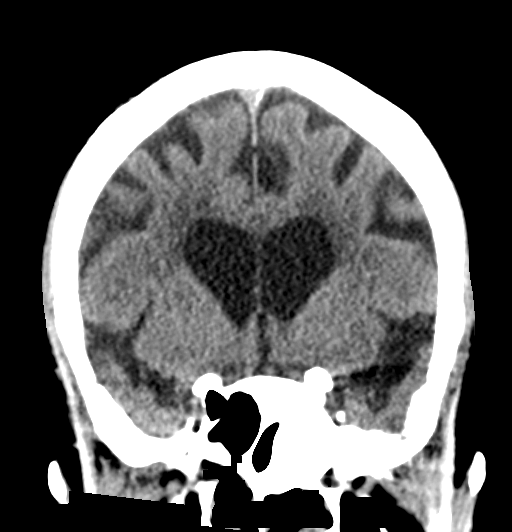

[Series 9: axial recon · axial · 0.18mm/px · z∈[+1294,+1417]mm · 8 of 92 slices shown, 10 images]
[im 11/92  brain]
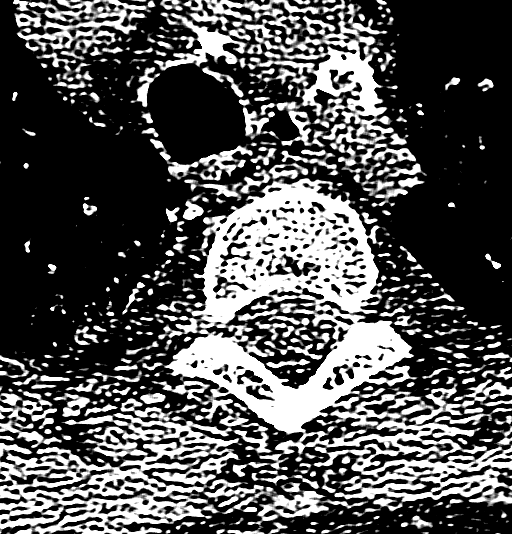
[im 11/92  bone]
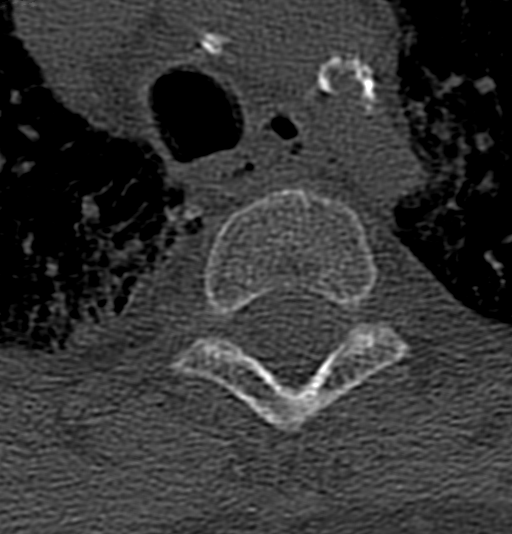
[im 21/92  brain]
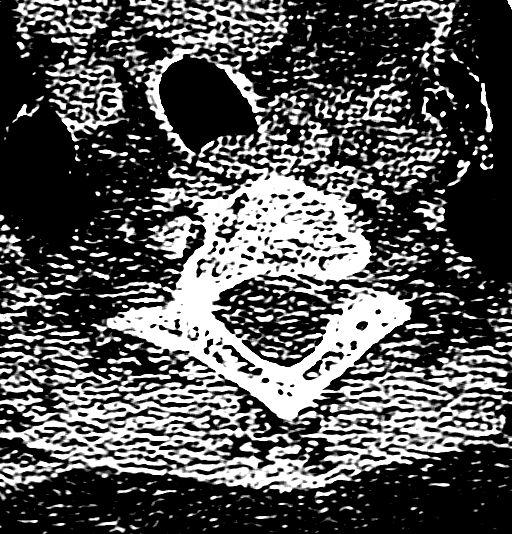
[im 31/92  brain]
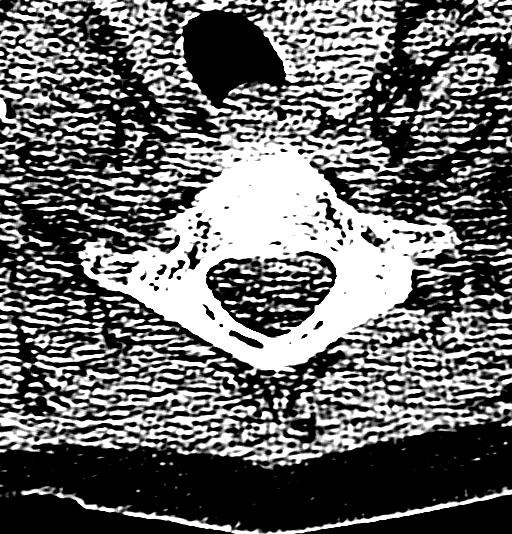
[im 41/92  brain]
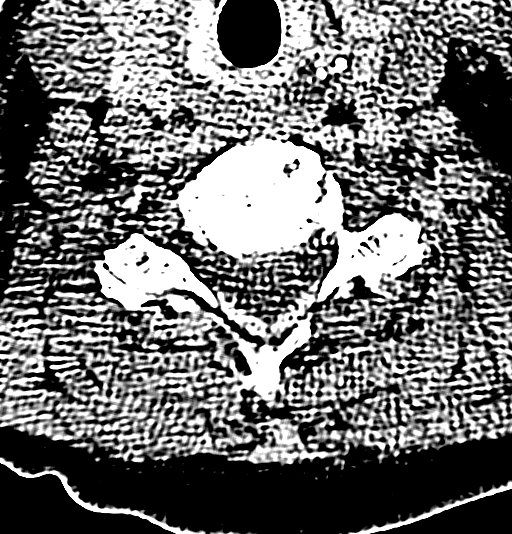
[im 51/92  brain]
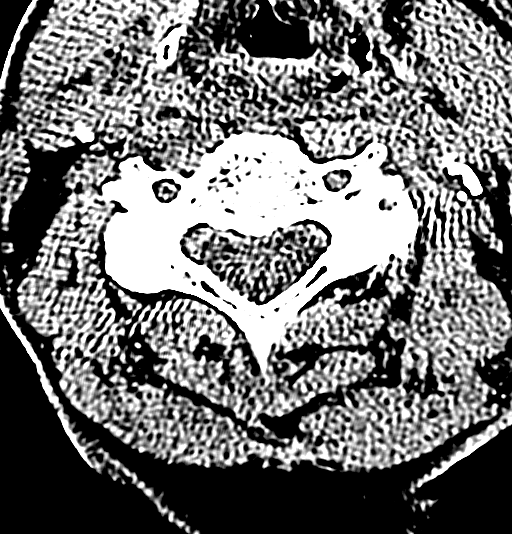
[im 51/92  bone]
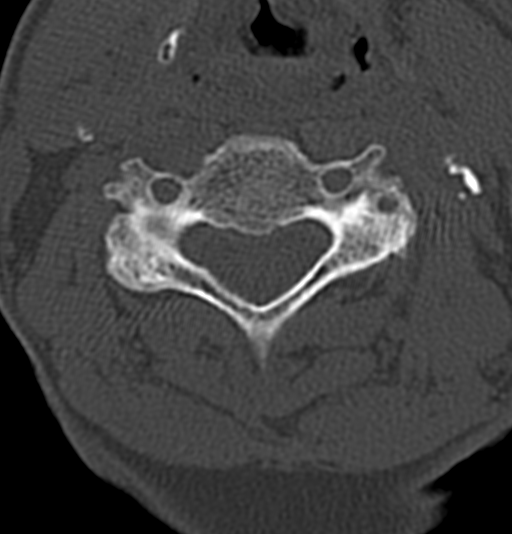
[im 61/92  brain]
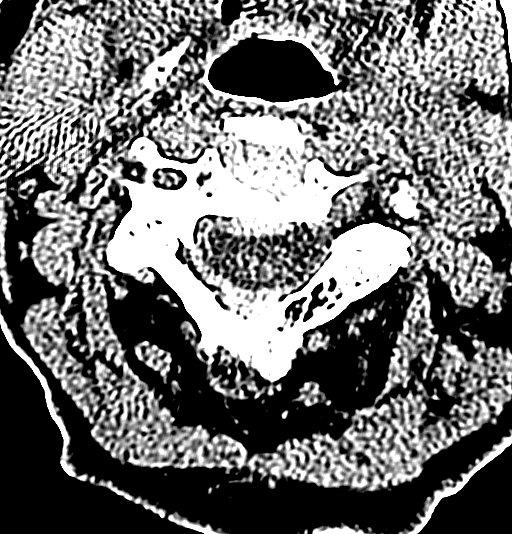
[im 71/92  brain]
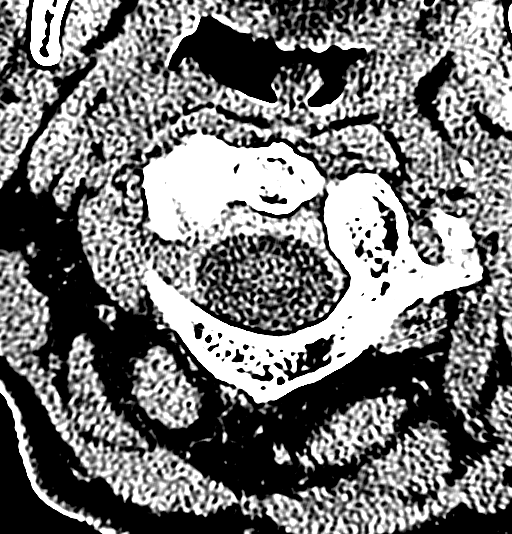
[im 81/92  brain]
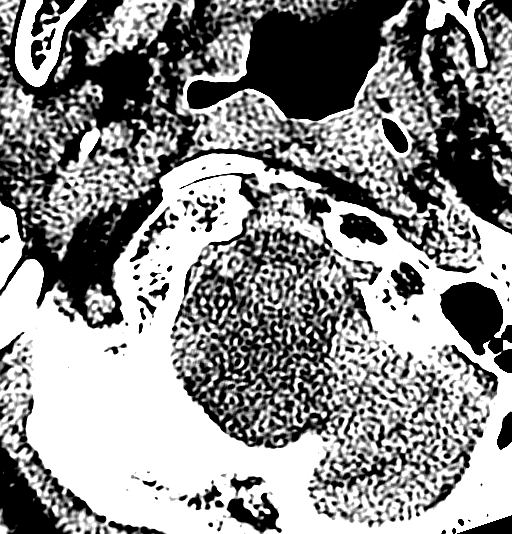

[Series 11: sagittal · sagittal · 0.19mm/px · 2 of 45 slices shown]
[im 15/45  brain]
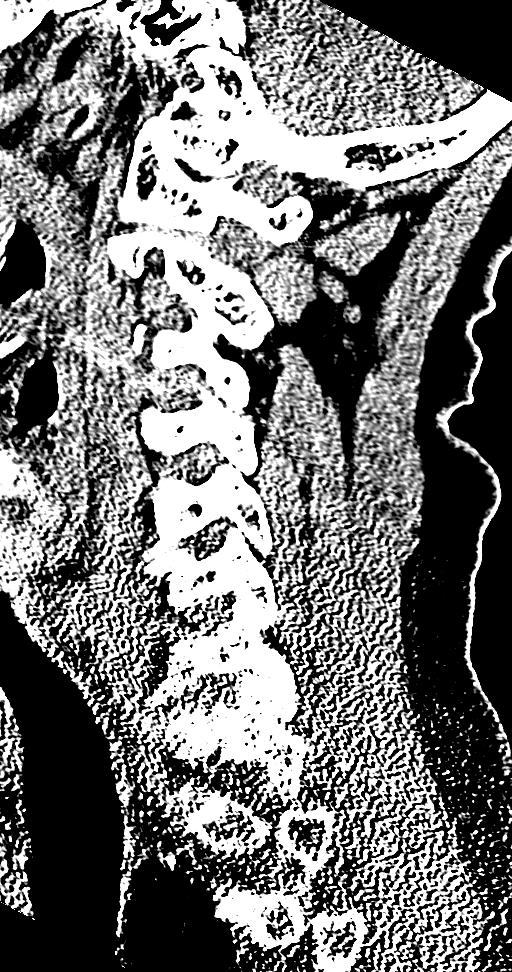
[im 30/45  brain]
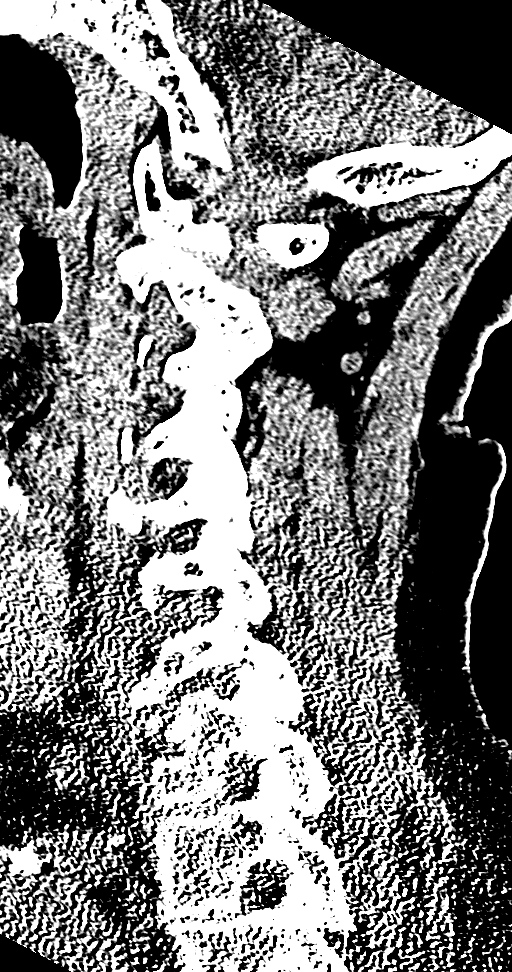

[13 of 47 positions shown; findings below may reference images not displayed]

FINDINGS: CT HEAD FINDINGS

INTRACRANIAL CONTENTS: Moderate to severe ventriculomegaly on the
basis of global parenchymal brain volume loss. No intraparenchymal
hemorrhage, mass effect nor midline shift. Patchy supratentorial
white matter hypodensities are within normal range for patient's age
and though non-specific likely represent chronic small vessel
ischemic disease. No acute large vascular territory infarcts. No
abnormal extra-axial fluid collections. Basal cisterns are patent.
Moderate calcific atherosclerosis of the carotid siphons.

ORBITS: The included ocular globes and orbital contents are
non-suspicious.

SINUSES: The mastoid aircells and included paranasal sinuses are
well-aerated.

SKULL/SOFT TISSUES: No skull fracture. No significant soft tissue
swelling. LEFT anterior skullbase fibrous dysplasia.

CT CERVICAL SPINE FINDINGS

ALIGNMENT: Cervical vertebral bodies in alignment. Straightened
cervical lordosis.

OSSEOUS STRUCTURES: Cervical vertebral bodies and posterior elements
are intact. Severe C5-6 disc height loss with uncovertebral
hypertrophy and subchondral cyst formation. Dx severe LEFT C4-5 and
RIGHT upper cervical facet arthropathy. No destructive bony lesions.
C1-2 articulation maintained.

SOFT TISSUES: Included prevertebral and paraspinal soft tissues are
unchanged. Biapical fibronodular scarring.
IMPRESSION: CT HEAD: No acute intracranial process.

Stable chronic changes including moderate to severe global brain
atrophy, advanced for age.

CT CERVICAL SPINE: Straightened cervical lordosis without acute
fracture or malalignment.
